# Patient Record
Sex: Male | Born: 2000 | Race: White | Hispanic: No | Marital: Single | Smoking: Never smoker
Health system: Southern US, Community
[De-identification: ages and names within clinical notes are randomized; demographics above are authoritative.]

## PROBLEM LIST (undated history)

## (undated) DIAGNOSIS — Z8674 Personal history of sudden cardiac arrest: Secondary | ICD-10-CM

## (undated) DIAGNOSIS — I4901 Ventricular fibrillation: Secondary | ICD-10-CM

## (undated) DIAGNOSIS — F79 Unspecified intellectual disabilities: Secondary | ICD-10-CM

## (undated) DIAGNOSIS — K254 Chronic or unspecified gastric ulcer with hemorrhage: Secondary | ICD-10-CM

## (undated) DIAGNOSIS — Q210 Ventricular septal defect: Secondary | ICD-10-CM

## (undated) DIAGNOSIS — I471 Supraventricular tachycardia, unspecified: Secondary | ICD-10-CM

## (undated) DIAGNOSIS — F840 Autistic disorder: Secondary | ICD-10-CM

## (undated) HISTORY — PX: VSD REPAIR: SHX276

## (undated) HISTORY — PX: WISDOM TOOTH EXTRACTION: SHX21

## (undated) HISTORY — DX: Ventricular fibrillation: I49.01

## (undated) HISTORY — PX: CYSTOURETHROSCOPY: SHX476

## (undated) HISTORY — PX: PACEMAKER IMPLANT: EP1218

## (undated) HISTORY — DX: Personal history of sudden cardiac arrest: Z86.74

## (undated) HISTORY — PX: ICD IMPLANT: EP1208

## (undated) HISTORY — PX: CARDIAC CATHETERIZATION: SHX172

---
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
## 2019-02-08 IMAGING — DX DG CHEST 1V PORT
1 series · 1 of 1 positions shown · non-contrast
Comparison: 08/29/2015

CLINICAL DATA: Syncope. Patient with history of cardiac arrest and
defibrillator.

EXAM:
PORTABLE CHEST 1 VIEW

[chest ap]
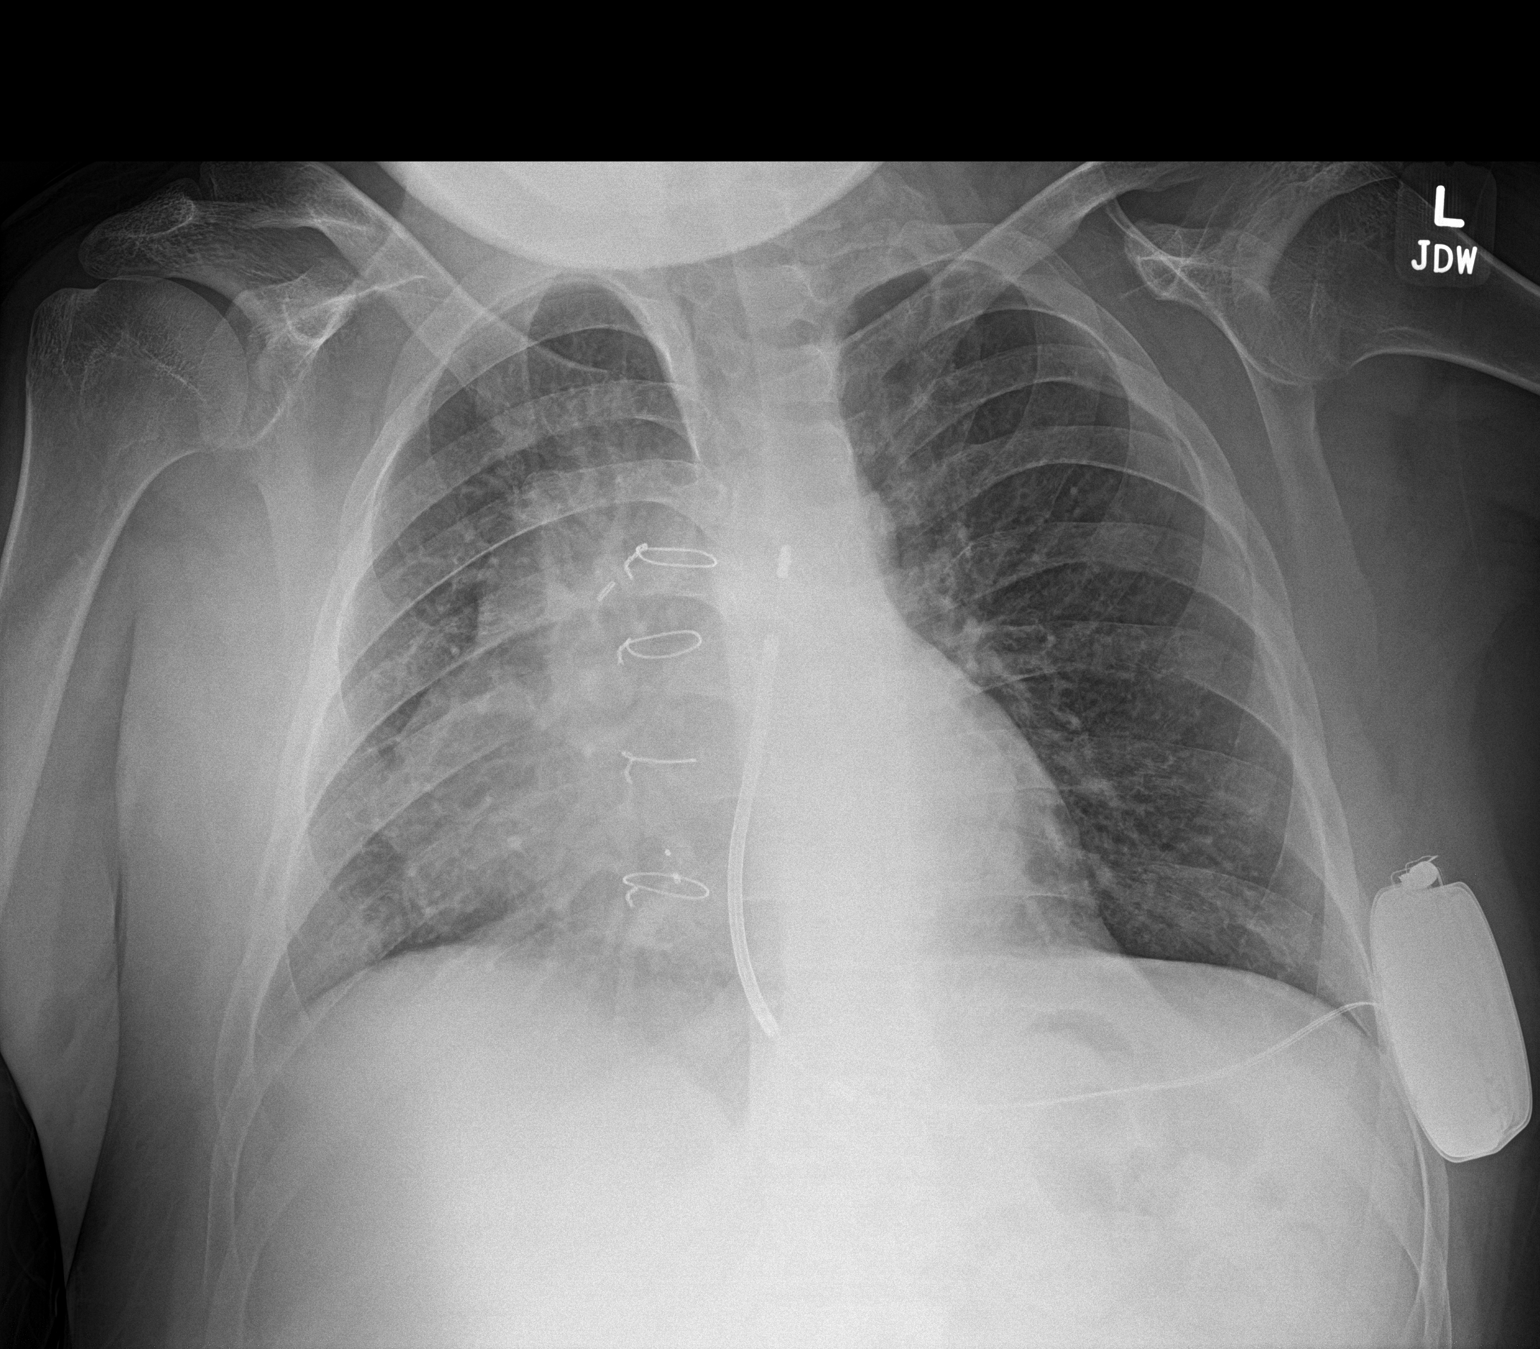

[1 of 1 positions shown; findings below may reference images not displayed]

FINDINGS: The patient is slightly rotated to the RIGHT.

Cardiomegaly and median sternotomy again noted.

A LEFT AICD noted.

There is no evidence of focal airspace disease, pulmonary edema,
suspicious pulmonary nodule/mass, pleural effusion, or pneumothorax.

No acute bony abnormalities are identified.
IMPRESSION: Cardiomegaly without evidence of acute cardiopulmonary disease.

## 2019-05-04 DIAGNOSIS — Z889 Allergy status to unspecified drugs, medicaments and biological substances status: Secondary | ICD-10-CM | POA: Insufficient documentation

## 2019-09-30 DIAGNOSIS — Z01818 Encounter for other preprocedural examination: Secondary | ICD-10-CM | POA: Insufficient documentation

## 2020-01-12 DIAGNOSIS — Z87440 Personal history of urinary (tract) infections: Secondary | ICD-10-CM | POA: Insufficient documentation

## 2020-11-22 ENCOUNTER — Other Ambulatory Visit: Payer: Self-pay

## 2020-11-22 ENCOUNTER — Other Ambulatory Visit (HOSPITAL_COMMUNITY): Payer: Self-pay

## 2020-11-22 ENCOUNTER — Ambulatory Visit (INDEPENDENT_AMBULATORY_CARE_PROVIDER_SITE_OTHER): Payer: Medicare Other

## 2020-11-22 DIAGNOSIS — U071 COVID-19: Secondary | ICD-10-CM

## 2020-11-22 MED ORDER — BEBTELOVIMAB 175 MG/2 ML IV (EUA)
175.0000 mg | Freq: Once | INTRAMUSCULAR | Status: AC
  Administered 2020-11-22: 175 mg via INTRAVENOUS

## 2020-11-22 MED ORDER — EPINEPHRINE 0.3 MG/0.3ML IJ SOAJ: 0.3000 mg | Freq: Once | INTRAMUSCULAR | Status: AC | PRN

## 2020-11-22 MED ORDER — METHYLPREDNISOLONE SODIUM SUCC 125 MG IJ SOLR: 125.0000 mg | Freq: Once | INTRAMUSCULAR | Status: AC | PRN

## 2020-11-22 MED ORDER — ALBUTEROL SULFATE HFA 108 (90 BASE) MCG/ACT IN AERS: 2.0000 | INHALATION_SPRAY | Freq: Once | RESPIRATORY_TRACT | Status: AC | PRN

## 2020-11-22 MED ORDER — SODIUM CHLORIDE 0.9 % IV SOLN: INTRAVENOUS | Status: DC | PRN

## 2020-11-22 MED ORDER — FAMOTIDINE IN NACL 20-0.9 MG/50ML-% IV SOLN: 20.0000 mg | Freq: Once | INTRAVENOUS | Status: AC | PRN

## 2020-11-22 MED ORDER — DIPHENHYDRAMINE HCL 50 MG/ML IJ SOLN: 50.0000 mg | Freq: Once | INTRAMUSCULAR | Status: AC | PRN

## 2020-11-22 NOTE — Progress Notes (Signed)
Diagnosis: COVID  Provider:  Chilton Greathouse, MD  Procedure: Infusion  IV Type: Peripheral, IV Location: R Hand  Bebtelovimab, Dose: 175 mg  Infusion Start Time: 1523  Infusion Stop Time: 1524  Post Infusion IV Care: Observation period completed and Peripheral IV Discontinued  Discharge: Condition: Good, Destination: Home . AVS provided to patient.   Performed by:  Garnette Czech, RN

## 2020-11-22 NOTE — Patient Instructions (Signed)
Patient was given drug information sheet for Bebtelovimab.  Also given cost estimate sheet for Bebtelovimab.  Patient reviewed documentation and questions were answered.  Patient would like to proceed with treatment at this time.   

## 2021-10-01 ENCOUNTER — Encounter: Payer: Self-pay | Admitting: Family

## 2021-10-01 ENCOUNTER — Ambulatory Visit (INDEPENDENT_AMBULATORY_CARE_PROVIDER_SITE_OTHER): Payer: Medicare Other | Admitting: Family

## 2021-10-01 VITALS — BP 120/86 | HR 79 | Temp 98.6°F | Ht 63.0 in | Wt 156.1 lb

## 2021-10-01 DIAGNOSIS — Z9109 Other allergy status, other than to drugs and biological substances: Secondary | ICD-10-CM | POA: Insufficient documentation

## 2021-10-01 DIAGNOSIS — F840 Autistic disorder: Secondary | ICD-10-CM | POA: Diagnosis not present

## 2021-10-01 DIAGNOSIS — F411 Generalized anxiety disorder: Secondary | ICD-10-CM

## 2021-10-01 DIAGNOSIS — I509 Heart failure, unspecified: Secondary | ICD-10-CM

## 2021-10-01 DIAGNOSIS — F79 Unspecified intellectual disabilities: Secondary | ICD-10-CM | POA: Insufficient documentation

## 2021-10-01 DIAGNOSIS — I872 Venous insufficiency (chronic) (peripheral): Secondary | ICD-10-CM

## 2021-10-01 DIAGNOSIS — D696 Thrombocytopenia, unspecified: Secondary | ICD-10-CM

## 2021-10-01 DIAGNOSIS — N390 Urinary tract infection, site not specified: Secondary | ICD-10-CM | POA: Diagnosis not present

## 2021-10-01 DIAGNOSIS — Z95810 Presence of automatic (implantable) cardiac defibrillator: Secondary | ICD-10-CM | POA: Diagnosis not present

## 2021-10-01 DIAGNOSIS — R310 Gross hematuria: Secondary | ICD-10-CM

## 2021-10-01 DIAGNOSIS — G479 Sleep disorder, unspecified: Secondary | ICD-10-CM | POA: Insufficient documentation

## 2021-10-01 DIAGNOSIS — D72810 Lymphocytopenia: Secondary | ICD-10-CM

## 2021-10-01 DIAGNOSIS — J309 Allergic rhinitis, unspecified: Secondary | ICD-10-CM | POA: Insufficient documentation

## 2021-10-01 DIAGNOSIS — Q249 Congenital malformation of heart, unspecified: Secondary | ICD-10-CM

## 2021-10-01 DIAGNOSIS — D509 Iron deficiency anemia, unspecified: Secondary | ICD-10-CM

## 2021-10-01 DIAGNOSIS — I270 Primary pulmonary hypertension: Secondary | ICD-10-CM

## 2021-10-01 DIAGNOSIS — Q204 Double inlet ventricle: Secondary | ICD-10-CM

## 2021-10-01 MED ORDER — TRAZODONE HCL 100 MG PO TABS
100.0000 mg | ORAL_TABLET | Freq: Every day | ORAL | 1 refills | Status: DC
Start: 2021-10-01 — End: 2022-02-26

## 2021-10-01 MED ORDER — LORAZEPAM 2 MG PO TABS: 2.0000 mg | ORAL_TABLET | ORAL | 0 refills | Status: DC | PRN

## 2021-10-01 NOTE — Assessment & Plan Note (Signed)
Refilled trazodone 100 mg po qhs

## 2021-10-01 NOTE — Patient Instructions (Signed)
  Welcome to our clinic, I am happy to have you as my new patient. I am excited to continue on this healthcare journey with you.  Stop by the lab prior to leaving today. I will notify you of your results once received.   Please keep in mind Any my chart messages you send have up to a three business day turnaround for a response.  Phone calls may take up to a one full business day turnaround for a  response.   If you need a medication refill I recommend you request it through the pharmacy as this is easiest for us rather than sending a message and or phone call.   Due to recent changes in healthcare laws, you may see results of your imaging and/or laboratory studies on MyChart before I have had a chance to review them.  I understand that in some cases there may be results that are confusing or concerning to you. Please understand that not all results are received at the same time and often I may need to interpret multiple results in order to provide you with the best plan of care or course of treatment. Therefore, I ask that you please give me 2 business days to thoroughly review all your results before contacting my office for clarification. Should we see a critical lab result, you will be contacted sooner.   It was a pleasure seeing you today! Please do not hesitate to reach out with any questions and or concerns.  Regards,   Dalynn Jhaveri FNP-C  

## 2021-10-01 NOTE — Assessment & Plan Note (Signed)
Continue sildenafil under guidance of cardiologist.

## 2021-10-01 NOTE — Assessment & Plan Note (Addendum)
Continue f/u with cardiologist. Referral placed also for vascular surgeon.  Also suspect arterial insufficiency from purple presentation.

## 2021-10-01 NOTE — Progress Notes (Signed)
New Patient Office Visit  Subjective:  Patient ID: Keith [name], male    DOB: 09/28/2000  Age: 21 y.o. MRN: [id number]  CC:  Chief Complaint  Patient presents with  . Establish Care    HPI Keith [name] is here to establish care as a new patient.  Prior provider was: Dr. Royce Macadamia, [facility]  Accompanied by mom.   Dr. Neena Rhymes, cardiologist:  Sees urology: recurrent UTI's . Follows with them prn   Autism spectrum disorder: [place] in [place], ativan prn especially when flying. Needs refill today for this.   chronic concerns:  IDA: taking iron supplement currently back when had a heart cath had some anemia with low platelets. Seeing hematologist for this currently, f/u/ is in September 2023.   Sleep disorder: hard to wind down at night, mom gives him 100 mg trazodone and helps him to calm down enough to sleep through the night.   Past Medical History:  Diagnosis Date  . Autism   . Gastric ulcer with hemorrhage 02/25/2015  . H/O cardiac arrest    after dental work in june 2021  . 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 SVT (supraventricular tachycardia) ([place])    previously 3x before, total of 4 including today 05/08/15  . Ventricular fibrillation ([place])    inducible during EP study Dr Neena Rhymes, [facility] 06/21/2015  . VSD (ventricular septal defect)     Past Surgical History:  Procedure Laterality Date  . CARDIAC CATHETERIZATION    . CYSTOURETHROSCOPY     x 2, 07/30/2016 and 01/16/2020  . FONTAN PROCEDURE, EXTRACARDIAC  05/28/2015  . ICD IMPLANT     defib  . PACEMAKER IMPLANT    . UPPER GI ENDOSCOPY  02/25/2015   for bleeding ulcer   . VSD REPAIR     3 surgeries -one was a bypass, one was a shunt and fontan procedure   . WISDOM TOOTH EXTRACTION      Family History  Problem Relation Age of Onset  . Prostate cancer Father        age 75  . ALS Maternal Grandmother   . Heart disease Maternal Grandmother   . Heart disease  Maternal Grandfather   . Throat cancer Paternal Grandfather     Social History   Socioeconomic History  . Marital status: Single    Spouse name: Not on file  . Number of children: 3  . Years of education: Not on file  . Highest education level: Not on file  Occupational History  . Not on file  Tobacco Use  . Smoking status: Never  . Smokeless tobacco: Never  Vaping Use  . Vaping Use: Never used  Substance and Sexual Activity  . Alcohol use: No  . Drug use: No  . Sexual activity: Never  Other Topics Concern  . Not on file  Social History Narrative  . Not on file   Social Determinants of Health   Financial Resource Strain: Not on file  Food Insecurity: Not on file  Transportation Needs: Not on file  Physical Activity: Not on file  Stress: Not on file  Social Connections: Not on file  Intimate Partner Violence: Not on file    Outpatient Medications Prior to Visit  Medication Sig Dispense Refill  . amiodarone (PACERONE) 200 MG tablet Take 300 mg by mouth daily.    Marland Kitchen aspirin EC 81 MG tablet Take 81 mg by mouth daily.    Marland Kitchen  Ferrous Sulfate (PC PEDIATRIC IRON DROPS PO) Take by mouth.    . furosemide (LASIX) 10 MG/ML solution Take 10 mg by mouth 2 (two) times daily.    Marland Kitchen loratadine (CHILDRENS LORATADINE) 5 MG/5ML syrup Take 10 mg by mouth daily as needed for allergies.     Marland Kitchen NEXIUM 40 MG packet Take 40 mg by mouth daily.  11  . Sildenafil Citrate 10 MG/ML SUSR Take 10 mg by mouth 3 (three) times daily.    Marland Kitchen spironolactone (ALDACTONE) 25 MG tablet Take 25 mg by mouth daily.  6  . Vitamin D3 (VITAMIN D) 25 MCG tablet Take 1,000 Units by mouth daily.    Marland Kitchen zinc oxide ([employer]S ZINC OXIDE) 20 % ointment Apply 1 application topically as needed for irritation.     Marland Kitchen liver oil-zinc oxide (DESITIN) 40 % ointment Apply 1 Application topically as needed.    Marland Kitchen LORazepam (ATIVAN) 2 MG tablet Take 2 mg by mouth as needed.    . traZODone (DESYREL) 100 MG tablet Take 100 mg by mouth at  bedtime.  5  . ketoconazole (NIZORAL) 2 % cream Apply 1 application topically as needed for irritation.  (Patient not taking: Reported on 10/01/2021)    . propranolol (INDERAL) 60 MG tablet Take 60 mg by mouth 3 (three) times daily.    . ATENOLOL PO Take 25-100 mg by mouth See admin instructions. Liquid Atenolol 5mg /ml; 100mg  in the morning and 25mg  at night (Patient not taking: Reported on 10/01/2021)    . MEXILETINE HCL PO Take 150 mg by mouth every 8 (eight) hours. Liquid Mexitil 10mg /ml; 56ml every 8 hours. (Patient not taking: Reported on 10/01/2021)    . potassium chloride (KLOR-CON) 20 MEQ packet Take 40 mEq by mouth 2 (two) times daily.    . Sotalol HCl 5 MG/ML SOLN Take 80 mg by mouth 2 (two) times daily.  (Patient not taking: Reported on 10/01/2021)    . 0.9 %  sodium chloride infusion      No facility-administered medications prior to visit.    Allergies  Allergen Reactions  . Adhesive [Tape] Itching and Rash    Able to tolerate paper tape     ROS Review of Systems  Review of Systems  Respiratory:  Negative for shortness of breath.   Cardiovascular:  Negative for chest pain and palpitations.  Gastrointestinal:  Negative for constipation and diarrhea.  Genitourinary:  Negative for dysuria, frequency and urgency.  Musculoskeletal:  Negative for myalgias.  Psychiatric/Behavioral:  Negative for depression and suicidal ideas.   All other systems reviewed and are negative.    Objective:    Physical Exam Constitutional:      General: He is not in acute distress.    Appearance: Normal appearance. He is normal weight. He is not ill-appearing, toxic-appearing or diaphoretic.  HENT:     Head: Normocephalic.     Right Ear: Tympanic membrane normal.     Left Ear: Tympanic membrane normal.     Nose: Nose normal.  Eyes:     Pupils: Pupils are equal, round, and reactive to light.  Cardiovascular:     Rate and Rhythm: Regular rhythm. Bradycardia present.     Pulses:          Dorsalis  pedis pulses are 1+ on the right side and 1+ on the left side.       Posterior tibial pulses are 1+ on the right side and 1+ on the left side.     Heart  sounds: Normal heart sounds.  Pulmonary:     Effort: Pulmonary effort is normal.     Breath sounds: Normal breath sounds.  Abdominal:     General: Abdomen is flat. Bowel sounds are normal.     Palpations: Abdomen is soft.     Tenderness: There is no abdominal tenderness.  Musculoskeletal:        General: Normal range of motion.     Cervical back: Normal range of motion.     Right lower leg: 1+ Edema present.     Left lower leg: 1+ Edema present.  Skin:    General: Skin is warm.     Comments: discoloration of bil le with purple pigmentation upon hanging over exam table. Easily blanchable.   Neurological:     General: No focal deficit present.     Mental Status: He is alert.     Motor: Motor function is intact. No weakness.     Coordination: Coordination is intact.     Gait: Gait is intact. Gait normal.  Psychiatric:        Attention and Perception: He is inattentive.        Mood and Affect: Mood normal.        Speech: He is noncommunicative.        Behavior: Behavior is withdrawn. Behavior is not agitated. Behavior is cooperative.        Cognition and Memory: Cognition is impaired.        Judgment: Judgment is inappropriate.      BP 120/86   Pulse 79   Temp 98.6 F (37 C)   Ht 5\' 3"  (1.6 m)   Wt 156 lb 2 oz (70.8 kg)   BMI 27.66 kg/m  Wt Readings from Last 3 Encounters:  10/01/21 156 lb 2 oz (70.8 kg)  11/22/20 157 lb (71.2 kg)  11/01/17 175 lb (79.4 kg) (83 %, Z= 0.96)*   * Growth percentiles are based on CDC (Boys, 2-20 Years) data.     Health Maintenance Due  Topic Date Due  . COVID-19 Vaccine (1) Never done  . HPV VACCINES (1 - Male 2-dose series) Never done  . HIV Screening  Never done  . Hepatitis C Screening  Never done  . INFLUENZA VACCINE  09/24/2021       Topic Date Due  . HPV VACCINES (1 - Male  2-dose series) Never done    No results found for: "TSH" Lab Results  Component Value Date   WBC 4.9 12/25/2018   HGB 12.6 (L) 12/25/2018   HCT 37.0 (L) 12/25/2018   MCV 79.9 (L) 12/25/2018   PLT 105 (L) 12/25/2018   Lab Results  Component Value Date   NA 139 12/25/2018   K 4.5 12/25/2018   CO2 22 12/25/2018   GLUCOSE 83 12/25/2018   BUN 37 (H) 12/25/2018   CREATININE 0.60 (L) 12/25/2018   BILITOT 1.0 12/25/2018   ALKPHOS 141 (H) 12/25/2018   AST 29 12/25/2018   ALT 34 12/25/2018   PROT 7.4 12/25/2018   ALBUMIN 4.4 12/25/2018   CALCIUM 9.5 12/25/2018   ANIONGAP 11 12/25/2018   No results found for: "CHOL" No results found for: "HDL" No results found for: "LDLCALC" No results found for: "TRIG" No results found for: "CHOLHDL" No results found for: "HGBA1C"    Assessment & Plan:   Problem List Items Addressed This Visit       Cardiovascular and Mediastinum   Pulmonary hypertension, primary (HCC)  Continue sildenafil under guidance of cardiologist.       Relevant Medications   Sildenafil Citrate 10 MG/ML SUSR   propranolol (INDERAL) 60 MG tablet   amiodarone (PACERONE) 200 MG tablet   Other Relevant Orders   Ambulatory referral to Vascular Surgery   Single ventricle with unbalanced atrioventricular canal    Continue f/u with cardiology as scheduled.       Relevant Medications   Sildenafil Citrate 10 MG/ML SUSR   propranolol (INDERAL) 60 MG tablet   amiodarone (PACERONE) 200 MG tablet   Other Relevant Orders   Ambulatory referral to Vascular Surgery   Heart failure due to congenital heart disease ([place])    continue lasix and spironolactone as prescribed. Continue ongoing f/u with cardiology as scheduled.       Relevant Medications   Sildenafil Citrate 10 MG/ML SUSR   propranolol (INDERAL) 60 MG tablet   amiodarone (PACERONE) 200 MG tablet     Respiratory   Allergic rhinitis    continue daily claritin as prescribed.         Musculoskeletal and  Integument   Venous stasis dermatitis of both lower extremities    Continue f/u with cardiologist. Referral placed also for vascular surgeon.  Also suspect arterial insufficiency from purple presentation.       Relevant Orders   Ambulatory referral to Vascular Surgery   Sensitivity to sunlight     Genitourinary   Recurrent UTI    Continue f/u with urology as scheduled for ongoing management      Gross hematuria    Cont f/u with urology as scheduled        Hematopoietic and Hemostatic   Thrombocytopenia ([place])    continue f/u with hematology         Other   Autism spectrum disorder - Primary   Relevant Medications   LORazepam (ATIVAN) 2 MG tablet   Other Relevant Orders   Ambulatory referral to Vascular Surgery   Anxiety state    pdmp reviewed rx ativan 2 mg for social situations or flights that are in need of reducing anxiety state.  Mom aware, and knows not to mix with other sedative medications.       Relevant Medications   LORazepam (ATIVAN) 2 MG tablet   traZODone (DESYREL) 100 MG tablet   Iron deficiency anemia    Continue ongoing f/u with hematology as scheduled. Continue daily iron supplement      Relevant Medications   Ferrous Sulfate (PC PEDIATRIC IRON DROPS PO)   Cardiac defibrillator in situ   Sleep disorder    Refilled trazodone 100 mg po qhs       Relevant Medications   traZODone (DESYREL) 100 MG tablet   Intellectual disability   Relevant Orders   Ambulatory referral to Vascular Surgery   Lymphopenia    continue f/u with hematology          Meds ordered this encounter  Medications  . LORazepam (ATIVAN) 2 MG tablet    Sig: Take 1 tablet (2 mg total) by mouth as needed for anxiety.    Dispense:  20 tablet    Refill:  0    Order Specific Question:   Supervising Provider    Answer:   BEDSOLE, AMY E [[id number]]  . traZODone (DESYREL) 100 MG tablet    Sig: Take 1 tablet (100 mg total) by mouth at bedtime.    Dispense:  90 tablet     Refill:  1    Order  Specific Question:   Supervising Provider    Answer:   Eliezer Lofts E [[id number]]    Follow-up: Return in about 1 year (around 10/02/2022) for annuall follow up appointment .    Eugenia Pancoast, FNP

## 2021-10-01 NOTE — Assessment & Plan Note (Signed)
Cont f/u with urology as scheduled

## 2021-10-01 NOTE — Assessment & Plan Note (Signed)
Continue f/u with urology as scheduled for ongoing management

## 2021-10-01 NOTE — Assessment & Plan Note (Signed)
continue daily claritin as prescribed.

## 2021-10-01 NOTE — Assessment & Plan Note (Signed)
Continue ongoing f/u with hematology as scheduled. Continue daily iron supplement

## 2021-10-01 NOTE — Assessment & Plan Note (Signed)
Continue f/u with cardiology as scheduled.  

## 2021-10-01 NOTE — Assessment & Plan Note (Signed)
continue f/u with hematology

## 2021-10-01 NOTE — Assessment & Plan Note (Signed)
continue f/u with hematology  

## 2021-10-01 NOTE — Assessment & Plan Note (Signed)
pdmp reviewed rx ativan 2 mg for social situations or flights that are in need of reducing anxiety state.  Mom aware, and knows not to mix with other sedative medications.

## 2021-10-01 NOTE — Assessment & Plan Note (Signed)
continue lasix and spironolactone as prescribed. Continue ongoing f/u with cardiology as scheduled.

## 2021-10-02 ENCOUNTER — Telehealth: Payer: Self-pay

## 2021-10-02 NOTE — Telephone Encounter (Signed)
Called and informed pt mom about this information. She said she would call pt cardiologist just to give them a heads up. I always told her if she has not heard anything in 2 week from vascular to give [place] a call back.

## 2021-10-02 NOTE — Telephone Encounter (Signed)
-----   Message from Tabitha Dugal, FNP sent at 10/01/2021  4:26 PM EDT ----- Regarding: referral Please call and advise mom I did decide to send in a referral for vascular due to purple lower extremities. She can also check with patient's cardiologist to see if this is deemed necessary however I ready through some of the cardiology notes, and he had only noted red discoloration above the ankle. Today with dangling of legs with some purpling. I think this would be helpful just to be assessed by vascular as well to check out the circulation.   

## 2021-10-02 NOTE — Telephone Encounter (Signed)
-----   Message from Mort Sawyers, [place] sent at 10/01/2021  4:26 PM EDT ----- Regarding: referral Please call and advise mom I did decide to send in a referral for vascular due to purple lower extremities. She can also check with patient's cardiologist to see if this is deemed necessary however I ready through some of the cardiology notes, and he had only noted red discoloration above the ankle. Today with dangling of legs with some purpling. I think this would be helpful just to be assessed by vascular as well to check out the circulation.

## 2021-10-02 NOTE — Telephone Encounter (Signed)
Left message to return call to our office.  

## 2021-10-09 ENCOUNTER — Other Ambulatory Visit: Payer: Self-pay | Admitting: Primary Care

## 2021-10-09 ENCOUNTER — Encounter: Payer: Self-pay | Admitting: Primary Care

## 2021-10-09 ENCOUNTER — Ambulatory Visit (INDEPENDENT_AMBULATORY_CARE_PROVIDER_SITE_OTHER): Payer: Medicare Other | Admitting: Primary Care

## 2021-10-09 ENCOUNTER — Telehealth: Payer: Self-pay

## 2021-10-09 ENCOUNTER — Telehealth: Payer: Medicare Other | Admitting: Nurse Practitioner

## 2021-10-09 VITALS — BP 130/74 | HR 67 | Ht 63.0 in | Wt 147.0 lb

## 2021-10-09 DIAGNOSIS — K591 Functional diarrhea: Secondary | ICD-10-CM

## 2021-10-09 DIAGNOSIS — R197 Diarrhea, unspecified: Secondary | ICD-10-CM | POA: Insufficient documentation

## 2021-10-09 DIAGNOSIS — E876 Hypokalemia: Secondary | ICD-10-CM

## 2021-10-09 LAB — BASIC METABOLIC PANEL
BUN: 17 mg/dL (ref 6–23)
CO2: 29 mEq/L (ref 19–32)
Calcium: 9.4 mg/dL (ref 8.4–10.5)
Chloride: 99 mEq/L (ref 96–112)
Creatinine, Ser: 0.97 mg/dL (ref 0.40–1.50)
GFR: 111.36 mL/min (ref >60.00)
Glucose, Bld: 106 mg/dL — ABNORMAL HIGH (ref 70–99)
Potassium: 2.6 mEq/L — CL (ref 3.5–5.1)
Sodium: 137 mEq/L (ref 135–145)

## 2021-10-09 LAB — CBC WITH DIFFERENTIAL/PLATELET
Basophils Absolute: 0.1 10*3/uL (ref 0.0–0.1)
Basophils Relative: 0.6 % (ref 0.0–3.0)
Eosinophils Absolute: 0 10*3/uL (ref 0.0–0.7)
Eosinophils Relative: 0.3 % (ref 0.0–5.0)
HCT: 47.9 % (ref 39.0–52.0)
Hemoglobin: 16.6 g/dL (ref 13.0–17.0)
Lymphocytes Relative: 4.8 % — ABNORMAL LOW (ref 12.0–46.0)
Lymphs Abs: 0.4 10*3/uL — ABNORMAL LOW (ref 0.7–4.0)
MCHC: 34.6 g/dL (ref 30.0–36.0)
MCV: 91.4 fl (ref 78.0–100.0)
Monocytes Absolute: 1 10*3/uL (ref 0.1–1.0)
Monocytes Relative: 10.4 % (ref 3.0–12.0)
Neutro Abs: 7.8 10*3/uL — ABNORMAL HIGH (ref 1.4–7.7)
Neutrophils Relative %: 83.9 % — ABNORMAL HIGH (ref 43.0–77.0)
Platelets: 75 10*3/uL — ABNORMAL LOW (ref 150.0–400.0)
RBC: 5.24 Mil/uL (ref 4.22–5.81)
RDW: 13.7 % (ref 11.5–15.5)
WBC: 9.3 10*3/uL (ref 4.0–10.5)

## 2021-10-09 MED ORDER — POTASSIUM CHLORIDE 20 MEQ/15ML (10%) PO SOLN: 20.0000 meq | Freq: Two times a day (BID) | ORAL | 0 refills | Status: DC

## 2021-10-09 NOTE — Telephone Encounter (Signed)
Seen by Mayra Reel. Information relayed to her

## 2021-10-09 NOTE — Progress Notes (Signed)
Subjective:    Patient ID: Keith [name], male    DOB: [birth year], 21 y.o.   MRN: [id number]  Diarrhea  Associated symptoms include vomiting. Pertinent negatives include no abdominal pain or fever.    Keith [name] is a very pleasant 21 y.o. male patient of Mort Sawyers, NP with a history of pulmonary hypertension, recurrent UTI, gross hematuria, autism spectrum disorder, cardiac defibrillator in situ who presents today for diarrhea.   His mother joins [place] today who provides information for HPI. Patient is non verbal.   Symptom onset five days ago with one episode of diarrhea. Four days ago he felt feverish to mom, temperature was not checked. He had 2-3 episodes of vomiting four days ago, no vomiting since. Over the last four days he's experienced 10 episodes of watery diarrhea daily.   Three days ago he began perking up, has slowly improved since then, appetite is still reduced overall. He has eaten a few times over the last several days. Today he's appearing better than he did yesterday per mom. He is playing and more interactive. His last episode of diarrhea was about 1 hour ago, has had 4 episodes total today.   His mother has not given him anything for symptoms given his amiodarone. His mother is pushing fluids and protein drinks. His mother cannot recall anything abnormal that he ate. She denies recent antibiotic use. No one else in the home is sick. He has not been grabbing his abdomen.     Review of Systems  Constitutional:  Positive for fatigue. Negative for fever.  Gastrointestinal:  Positive for diarrhea and vomiting. Negative for abdominal pain.         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   SVT (supraventricular tachycardia) (HCC)    previously 3x before, total of 4 including today 05/08/15    Ventricular fibrillation (HCC)    inducible during EP study Dr Selmer [place], [facility] 06/21/2015   VSD (ventricular septal defect)     Social History   Socioeconomic History   Marital status: Single    Spouse name: Not on file   Number of children: 3   Years of education: Not on file   Highest education level: Not on file  Occupational History   Not on file  Tobacco Use   Smoking status: Never   Smokeless tobacco: Never  Vaping Use   Vaping Use: Never used  Substance and Sexual Activity   Alcohol use: No   Drug use: No   Sexual activity: Never  Other Topics Concern   Not on file  Social History Narrative   Not on file   Social Determinants of Health   Financial Resource Strain: Not on file  Food Insecurity: Not on file  Transportation Needs: Not on file  Physical Activity: Not on file  Stress: Not on file  Social Connections: Not on file  Intimate Partner Violence: Not on file    Past Surgical History:  Procedure Laterality Date   CARDIAC CATHETERIZATION     CYSTOURETHROSCOPY     x 2, 07/30/2016 and 01/16/2020   FONTAN PROCEDURE, EXTRACARDIAC  05/28/2015   ICD IMPLANT     defib   PACEMAKER IMPLANT     UPPER GI ENDOSCOPY  02/25/2015   for bleeding ulcer  VSD REPAIR     3 surgeries -one was a bypass, one was a shunt and fontan procedure    WISDOM TOOTH EXTRACTION      Family History  Problem Relation Age of Onset   Prostate cancer Father        age 72   ALS Maternal Grandmother    Heart disease Maternal Grandmother    Heart disease Maternal Grandfather    Throat cancer Paternal Grandfather     Allergies  Allergen Reactions   Adhesive [Tape] Itching and Rash    Able to tolerate paper tape    Silicone Rash    Current Outpatient Medications on File Prior to Visit  Medication Sig Dispense Refill   amiodarone (PACERONE) 200 MG tablet Take 300 mg by mouth daily.     aspirin EC 81 MG tablet Take 81 mg by mouth daily.     Ferrous Sulfate (PC PEDIATRIC IRON  DROPS PO) Take by mouth.     furosemide (LASIX) 10 MG/ML solution Take 10 mg by mouth 2 (two) times daily.     ketoconazole (NIZORAL) 2 % cream Apply 1 application  topically as needed for irritation.     loratadine (CHILDRENS LORATADINE) 5 MG/5ML syrup Take 10 mg by mouth daily as needed for allergies.      LORazepam (ATIVAN) 2 MG tablet Take 1 tablet (2 mg total) by mouth as needed for anxiety. 20 tablet 0   NEXIUM 40 MG packet Take 40 mg by mouth daily.  11   propranolol (INDERAL) 60 MG tablet Take 60 mg by mouth 3 (three) times daily.     Sildenafil Citrate 10 MG/ML SUSR Take 10 mg by mouth 3 (three) times daily.     spironolactone (ALDACTONE) 25 MG tablet Take 25 mg by mouth daily.  6   traZODone (DESYREL) 100 MG tablet Take 1 tablet (100 mg total) by mouth at bedtime. 90 tablet 1   Vitamin D3 (VITAMIN D) 25 MCG tablet Take 1,000 Units by mouth daily.     zinc oxide ([employer]S ZINC OXIDE) 20 % ointment Apply 1 application topically as needed for irritation.      No current facility-administered medications on file prior to visit.    BP 130/74   Pulse 67   Ht 5\' 3"  (1.6 m)   Wt 147 lb (66.7 kg)   SpO2 97%   BMI 26.04 kg/m  Objective:   Physical Exam Cardiovascular:     Rate and Rhythm: Normal rate and regular rhythm.  Pulmonary:     Effort: Pulmonary effort is normal.  Abdominal:     General: Bowel sounds are normal.     Palpations: Abdomen is soft.     Tenderness: There is no abdominal tenderness.  Skin:    General: Skin is warm and dry.  Neurological:     Mental Status: He is alert.     Comments: Playing on mom's phone, smiling at times, does put his head down in her lap a few times.            Assessment & Plan:   Problem List Items Addressed This Visit       Digestive   Acute diarrhea - Primary    Unclear etiology at this point. Fortunately, he is improving per mom. He appears stable today. Vitals WNL.   Stool studies sent off for testing. Labs pending today  for CBC and BMP.  Encouraged fluids and increasing diet as tolerated.  Start Pepto Bismol PRN.  Mom will update.        Relevant Orders   Gastrointestinal Pathogen Pnl RT, PCR   C. difficile GDH and Toxin A/B   Giardia antigen   CBC with Differential/Platelet   Basic metabolic panel       Doreene Nest, NP

## 2021-10-09 NOTE — Progress Notes (Signed)
Virtual Visit Consent   Keith [name], you are scheduled for a virtual visit with Mary-Margaret Daphine Deutscher, FNP, a [facility] provider, today.     Just as with appointments in the office, your consent must be obtained to participate.  Your consent will be active for this visit and any virtual visit you may have with one of our providers in the next 365 days.     If you have a MyChart account, a copy of this consent can be sent to you electronically.  All virtual visits are billed to your insurance company just like a traditional visit in the office.    As this is a virtual visit, video technology does not allow for your provider to perform a traditional examination.  This may limit your provider's ability to fully assess your condition.  If your provider identifies any concerns that need to be evaluated in person or the need to arrange testing (such as labs, EKG, etc.), we will make arrangements to do so.     Although advances in technology are sophisticated, we cannot ensure that it will always work on either your end or our end.  If the connection with a video visit is poor, the visit may have to be switched to a telephone visit.  With either a video or telephone visit, we are not always able to ensure that we have a secure connection.     I need to obtain your verbal consent now.   Are you willing to proceed with your visit today? YES   Keith [name] has provided verbal consent on 10/09/2021 for a virtual visit (video or telephone).   Mary-Margaret Daphine Deutscher, FNP   Date: 10/09/2021 8:00 AM   Virtual Visit via Video Note   I, Mary-Margaret Daphine Deutscher, connected with Keith [name] ([id number], Jul 19, 2000) on 10/09/21 at  8:00 AM EDT by a video-enabled telemedicine application and verified that I am speaking with the correct person using two identifiers.  Location: Patient: Virtual Visit Location Patient: Home Provider: Virtual Visit Location Provider: Mobile   I discussed the  limitations of evaluation and management by telemedicine and the availability of in person appointments. The patient expressed understanding and agreed to proceed.    History of Present Illness: Keith [name] is a 21 y.o. who identifies as a male who was assigned male at birth, and is being seen today for diarrhea .  HPI: Diarrhea  This is a new problem. The current episode started in the past 7 days. The problem occurs 5 to 10 times per day. The stool consistency is described as Blood tinged (only this morning). The patient states that diarrhea awakens him from sleep. Associated symptoms include vomiting (only x1). Pertinent negatives include no abdominal pain or fever. Nothing aggravates the symptoms. There are no known risk factors.    Review of Systems  Constitutional:  Negative for fever.  Gastrointestinal:  Positive for diarrhea and vomiting (only x1). Negative for abdominal pain.    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) 10/01/2021   Sensitivity to sunlight 10/01/2021   Intellectual disability 10/01/2021   Lymphopenia 10/01/2021   Thrombocytopenia (HCC) 10/01/2021    Allergies:  Allergies  Allergen Reactions   Adhesive [Tape] Itching and Rash    Able to tolerate paper tape    Medications:  Current Outpatient Medications:    amiodarone (PACERONE) 200 MG tablet, Take 300 mg by mouth daily., Disp: , Rfl:    aspirin EC 81 MG tablet, Take 81 mg by mouth  daily., Disp: , Rfl:    Ferrous Sulfate (PC PEDIATRIC IRON DROPS PO), Take by mouth., Disp: , Rfl:    furosemide (LASIX) 10 MG/ML solution, Take 10 mg by mouth 2 (two) times daily., Disp: , Rfl:    ketoconazole (NIZORAL) 2 % cream, Apply 1 application topically as needed for irritation.  (Patient not taking: Reported on 10/01/2021), Disp: , Rfl:    loratadine (CHILDRENS LORATADINE) 5 MG/5ML syrup, Take 10 mg by mouth daily as needed for allergies. , Disp: , Rfl:    LORazepam (ATIVAN) 2 MG tablet, Take 1 tablet (2 mg total) by mouth as needed for anxiety., Disp: 20 tablet, Rfl: 0   NEXIUM 40 MG packet, Take 40 mg by mouth daily., Disp: , Rfl: 11   propranolol (INDERAL) 60 MG tablet, Take 60 mg by mouth 3 (three) times daily., Disp: , Rfl:    Sildenafil Citrate 10 MG/ML SUSR, Take 10 mg by mouth 3 (three) times daily., Disp: , Rfl:    spironolactone (ALDACTONE) 25 MG tablet, Take 25 mg by mouth daily., Disp: , Rfl: 6   traZODone (DESYREL) 100 MG tablet, Take 1 tablet (100 mg total) by mouth at bedtime., Disp: 90 tablet, Rfl: 1   Vitamin D3 (VITAMIN D) 25 MCG tablet, Take 1,000 Units by mouth daily., Disp: , Rfl:    zinc oxide ([employer]S ZINC OXIDE) 20 % ointment, Apply 1 application topically as needed for irritation. , Disp: , Rfl:   Observations/Objective: Patient is well-developed, well-nourished in no acute distress.  Resting comfortably  at home.  Head is normocephalic, atraumatic.  No labored breathing.  Speech is clear and coherent with logical content.  Patient is alert and oriented at baseline.  Patient is autisitic and cannot answer any questions.  Assessment and Plan:  Keith [name] in today with chief complaint of Diarrhea   1. Functional diarrhea No imodium due to interaction with amiodarone Try pepto bismol OTC- if no relief by tomorrow morning- will need in person visit [place]land diet Force fluids    Follow Up Instructions: I discussed the assessment and treatment plan with  the patient. The patient was provided an opportunity to ask questions and all were answered. The patient agreed with the plan and demonstrated an understanding of the instructions.  A copy of instructions were sent to the patient via MyChart.  The patient was advised to call back or seek an in-person evaluation if the symptoms worsen or if the condition fails to improve as anticipated.  Time:  I spent 10 minutes with the patient via telehealth technology discussing the above problems/concerns.    Mary-Margaret Daphine Deutscher, FNP

## 2021-10-09 NOTE — Telephone Encounter (Signed)
Critical lab result called by Sy at [facility] Potassium 2.6 Notified [employer] in person.

## 2021-10-09 NOTE — Patient Instructions (Signed)
Diarrhea, Adult Diarrhea is when you pass loose and watery poop (stool) often. Diarrhea can make you feel weak and cause you to lose water in your body (get dehydrated). Losing water in your body can cause you to: Feel tired and thirsty. Have a dry mouth. Go pee (urinate) less often. Diarrhea often lasts 2-3 days. However, it can last longer if it is a sign of something more serious. It is important to treat your diarrhea as told by your doctor. Follow these instructions at home: Eating and drinking     Follow these instructions as told by your doctor: Take an ORS (oral rehydration solution). This is a drink that helps you replace fluids and minerals your body lost. It is sold at pharmacies and stores. Drink plenty of fluids, such as: Water. Ice chips. Diluted fruit juice. Low-calorie sports drinks. Milk, if you want. Avoid drinking fluids that have a lot of sugar or caffeine in them. Eat bland, easy-to-digest foods in small amounts as you are able. These foods include: Bananas. Applesauce. Rice. Low-fat (lean) meats. Toast. Crackers. Avoid alcohol. Avoid spicy or fatty foods.  Medicines Take over-the-counter and prescription medicines only as told by your doctor. If you were prescribed an antibiotic medicine, take it as told by your doctor. Do not stop using the antibiotic even if you start to feel better. General instructions  Wash your hands often using soap and water. If soap and water are not available, use a hand sanitizer. Others in your home should wash their hands as well. Hands should be washed: After using the toilet or changing a diaper. Before preparing, cooking, or serving food. While caring for a sick person. While visiting someone in a hospital. Drink enough fluid to keep your pee (urine) pale yellow. Rest at home while you get better. Take a warm bath to help with any burning or pain from having diarrhea. Watch your condition for any changes. Keep all  follow-up visits as told by your doctor. This is important. Contact a doctor if: You have a fever. Your diarrhea gets worse. You have new symptoms. You cannot keep fluids down. You feel light-headed or dizzy. You have a headache. You have muscle cramps. Get help right away if: You have chest pain. You feel very weak or you pass out (faint). You have bloody or black poop or poop that looks like tar. You have very bad pain, cramping, or bloating in your belly (abdomen). You have trouble breathing or you are breathing very quickly. Your heart is beating very quickly. Your skin feels cold and clammy. You feel confused. You have signs of losing too much water in your body, such as: Dark pee, very little pee, or no pee. Cracked lips. Dry mouth. Sunken eyes. Sleepiness. Weakness. Summary Diarrhea is when you pass loose and watery poop (stool) often. Diarrhea can make you feel weak and cause you to lose water in your body (get dehydrated). Take an ORS (oral rehydration solution). This is a drink that is sold at pharmacies and stores. Eat bland, easy-to-digest foods in small amounts as you are able. Contact a doctor if your condition gets worse. Get help right away if you have signs that you have lost too much water in your body. This information is not intended to replace advice given to you by your health care provider. Make sure you discuss any questions you have with your health care provider. Document Revised: 05/03/2021 Document Reviewed: 08/22/2020 Elsevier Patient Education  2023 [employer]vier Inc.  

## 2021-10-09 NOTE — Patient Instructions (Addendum)
We will be in touch soon regarding results.  Continue to work on hydration.   You may provide him with Pepto Bismol as needed.  It was a pleasure to see you today!  Diarrhea, Adult Diarrhea is frequent loose and watery bowel movements. Diarrhea can make you feel weak and cause you to become dehydrated. Dehydration can make you tired and thirsty, cause you to have a dry mouth, and decrease how often you urinate. Diarrhea typically lasts 2-3 days. However, it can last longer if it is a sign of something more serious. It is important to treat your diarrhea as told by your health care provider. Follow these instructions at home: Eating and drinking     Follow these recommendations as told by your health care provider: Take an oral rehydration solution (ORS). This is an over-the-counter medicine that helps return your body to its normal balance of nutrients and water. It is found at pharmacies and retail stores. Drink plenty of fluids, such as water, ice chips, diluted fruit juice, and low-calorie sports drinks. You can drink milk also, if desired. Avoid drinking fluids that contain a lot of sugar or caffeine, such as energy drinks, sports drinks, and soda. Eat bland, easy-to-digest foods in small amounts as you are able. These foods include bananas, applesauce, rice, lean meats, toast, and crackers. Avoid alcohol. Avoid spicy or fatty foods.  Medicines Take over-the-counter and prescription medicines only as told by your health care provider. If you were prescribed an antibiotic medicine, take it as told by your health care provider. Do not stop using the antibiotic even if you start to feel better. General instructions  Wash your hands often using soap and water. If soap and water are not available, use a hand sanitizer. Others in the household should wash their hands as well. Hands should be washed: After using the toilet or changing a diaper. Before preparing, cooking, or serving  food. While caring for a sick person or while visiting someone in a hospital. Drink enough fluid to keep your urine pale yellow. Rest at home while you recover. Watch your condition for any changes. Take a warm bath to relieve any burning or pain from frequent diarrhea episodes. Keep all follow-up visits as told by your health care provider. This is important. Contact a health care provider if: You have a fever. Your diarrhea gets worse. You have new symptoms. You cannot keep fluids down. You feel light-headed or dizzy. You have a headache. You have muscle cramps. Get help right away if: You have chest pain. You feel extremely weak or you faint. You have bloody or black stools or stools that look like tar. You have severe pain, cramping, or bloating in your abdomen. You have trouble breathing or you are breathing very quickly. Your heart is beating very quickly. Your skin feels cold and clammy. You feel confused. You have signs of dehydration, such as: Dark urine, very little urine, or no urine. Cracked lips. Dry mouth. Sunken eyes. Sleepiness. Weakness. Summary Diarrhea is frequent loose and sometimes watery bowel movements. Diarrhea can make you feel weak and cause you to become dehydrated. Drink enough fluids to keep your urine pale yellow. Make sure that you wash your hands after using the toilet. If soap and water are not available, use hand sanitizer. Contact a health care provider if your diarrhea gets worse or you have new symptoms. Get help right away if you have signs of dehydration. This information is not intended to replace advice given  to you by your health care provider. Make sure you discuss any questions you have with your health care provider. Document Revised: 05/03/2021 Document Reviewed: 08/22/2020 Elsevier Patient Education  2023 [employer].

## 2021-10-09 NOTE — Assessment & Plan Note (Signed)
Unclear etiology at this point. Fortunately, he is improving per mom. He appears stable today. Vitals WNL.   Stool studies sent off for testing. Labs pending today for CBC and BMP.  Encouraged fluids and increasing diet as tolerated.  Start Pepto Bismol PRN.  Mom will update.

## 2021-10-10 ENCOUNTER — Other Ambulatory Visit: Payer: Self-pay | Admitting: Family

## 2021-10-10 DIAGNOSIS — E876 Hypokalemia: Secondary | ICD-10-CM

## 2021-10-10 LAB — C. DIFFICILE GDH AND TOXIN A/B
GDH ANTIGEN: NOT DETECTED
MICRO NUMBER:: 13787874
SPECIMEN QUALITY:: ADEQUATE
TOXIN A AND B: NOT DETECTED

## 2021-10-10 NOTE — Progress Notes (Signed)
ERUM

## 2021-10-11 ENCOUNTER — Other Ambulatory Visit (INDEPENDENT_AMBULATORY_CARE_PROVIDER_SITE_OTHER): Payer: Medicare Other

## 2021-10-11 DIAGNOSIS — E876 Hypokalemia: Secondary | ICD-10-CM

## 2021-10-11 LAB — GASTROINTESTINAL PATHOGEN PNL
CampyloBacter Group: NOT DETECTED
Norovirus GI/GII: NOT DETECTED
Rotavirus A: NOT DETECTED
Salmonella species: DETECTED — AB
Shiga Toxin 1: NOT DETECTED
Shiga Toxin 2: NOT DETECTED
Shigella Species: NOT DETECTED
Vibrio Group: NOT DETECTED
Yersinia enterocolitica: NOT DETECTED

## 2021-10-11 LAB — GIARDIA ANTIGEN
MICRO NUMBER:: 13787846
RESULT:: NOT DETECTED
SPECIMEN QUALITY:: ADEQUATE

## 2021-10-11 LAB — ISOLATE REFERRAL PH
MICRO NUMBER:: 13795361
SPECIMEN QUALITY:: ADEQUATE

## 2021-10-11 LAB — POTASSIUM: Potassium: 3 mEq/L — ABNORMAL LOW (ref 3.5–5.1)

## 2021-10-11 NOTE — Telephone Encounter (Signed)
noted 

## 2021-10-15 ENCOUNTER — Other Ambulatory Visit: Payer: Self-pay | Admitting: Family

## 2021-10-15 DIAGNOSIS — E876 Hypokalemia: Secondary | ICD-10-CM

## 2021-10-15 MED ORDER — POTASSIUM CHLORIDE 20 MEQ/15ML (10%) PO SOLN: 20.0000 meq | Freq: Every day | ORAL | 0 refills | Status: DC

## 2021-10-15 NOTE — Progress Notes (Signed)
Potassium improved some.  Let's continue with 20 meq once daily instead of twice daily for one week then have him f/u one week to see how he is doing.   How are we doing? Staying hydrated? Is he acting ok?

## 2021-10-16 ENCOUNTER — Telehealth: Payer: Self-pay

## 2021-10-16 NOTE — Telephone Encounter (Signed)
Mom called in to schedule follow up. She does state that in regards to Keith [name]'s message Keith [name] has been acting like himself again and believes everything has returned to normal.

## 2021-10-17 ENCOUNTER — Ambulatory Visit: Payer: Medicare Other | Admitting: Primary Care

## 2021-10-24 ENCOUNTER — Encounter: Payer: Self-pay | Admitting: Family

## 2021-10-24 ENCOUNTER — Ambulatory Visit (INDEPENDENT_AMBULATORY_CARE_PROVIDER_SITE_OTHER): Payer: Medicare Other | Admitting: Family

## 2021-10-24 VITALS — HR 62 | Temp 98.6°F | Ht 63.0 in | Wt 154.0 lb

## 2021-10-24 DIAGNOSIS — E876 Hypokalemia: Secondary | ICD-10-CM

## 2021-10-24 DIAGNOSIS — R197 Diarrhea, unspecified: Secondary | ICD-10-CM | POA: Diagnosis not present

## 2021-10-24 NOTE — Progress Notes (Signed)
Established Patient Office Visit  Subjective:  Patient ID: Keith [name], male    DOB: 10/22/2000  Age: 21 y.o. MRN: [id number]  CC:  Chief Complaint  Patient presents with   GI Problem    Follow up     HPI Keith [name] is here today for follow up.   Hypokalemia: was supplemented with potassium 20 meq twice daily x five days, then was followed by 20 meq once daily x five days  CDIFF negative for diarrhea. Was salmonella positive Since then is eating and drinking ok per mom, no longer with diarrhea or soft bowel movements.  Mom states he is again full of energy and not acting weak as he was before.    Lab Results  Component Value Date   K 3.0 (L) 10/11/2021   Prior to this 8/16 was 2.6 sfdf  Low platelets, has been chronic. He has f/u with hematologist next month. Has been 70 at 08/19/21 in the hospital.  Lab Results  Component Value Date   WBC 9.3 10/09/2021   HGB 16.6 10/09/2021   HCT 47.9 10/09/2021   MCV 91.4 10/09/2021   PLT 75.0 (L) 10/09/2021    venous stasis dermatitis of bil le: mom has d/w with his cardiologist, and they were monitoring this at every visit every year.    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   SVT (supraventricular tachycardia) (HCC)    previously 3x before, total of 4 including today 05/08/15   Ventricular fibrillation (HCC)    inducible during EP study Dr Selmer [place], [facility] 06/21/2015   VSD (ventricular septal defect)     Past Surgical History:  Procedure Laterality Date   CARDIAC CATHETERIZATION     CYSTOURETHROSCOPY     x 2, 07/30/2016 and 01/16/2020   FONTAN PROCEDURE, EXTRACARDIAC  05/28/2015   ICD IMPLANT     defib   PACEMAKER IMPLANT     UPPER GI ENDOSCOPY  02/25/2015   for bleeding ulcer    VSD REPAIR     3 surgeries -one was a bypass, one was a shunt and  fontan procedure    WISDOM TOOTH EXTRACTION      Family History  Problem Relation Age of Onset   Prostate cancer Father        age 67   ALS Maternal Grandmother    Heart disease Maternal Grandmother    Heart disease Maternal Grandfather    Throat cancer Paternal Grandfather     Social History   Socioeconomic History   Marital status: Single    Spouse name: Not on file   Number of children: 3   Years of education: Not on file   Highest education level: Not on file  Occupational History   Not on file  Tobacco Use   Smoking status: Never   Smokeless tobacco: Never  Vaping Use   Vaping Use: Never used  Substance and Sexual Activity   Alcohol use: No   Drug use: No   Sexual activity: Never  Other Topics Concern   Not on file  Social History Narrative   Not on file   Social Determinants of Health   Financial Resource Strain: Not on file  Food Insecurity: Not on file  Transportation Needs: Not on file  Physical Activity:  Not on file  Stress: Not on file  Social Connections: Not on file  Intimate Partner Violence: Not on file    Outpatient Medications Prior to Visit  Medication Sig Dispense Refill   amiodarone (PACERONE) 200 MG tablet Take 300 mg by mouth daily.     aspirin EC 81 MG tablet Take 81 mg by mouth daily.     Ferrous Sulfate (PC PEDIATRIC IRON DROPS PO) Take by mouth.     furosemide (LASIX) 10 MG/ML solution Take 10 mg by mouth 2 (two) times daily.     ketoconazole (NIZORAL) 2 % cream Apply 1 application  topically as needed for irritation.     loratadine (CHILDRENS LORATADINE) 5 MG/5ML syrup Take 10 mg by mouth daily as needed for allergies.      LORazepam (ATIVAN) 2 MG tablet Take 1 tablet (2 mg total) by mouth as needed for anxiety. 20 tablet 0   NEXIUM 40 MG packet Take 40 mg by mouth daily.  11   propranolol (INDERAL) 60 MG tablet Take 60 mg by mouth 3 (three) times daily.     Sildenafil Citrate 10 MG/ML SUSR Take 10 mg by mouth 3 (three) times  daily.     spironolactone (ALDACTONE) 25 MG tablet Take 25 mg by mouth daily.  6   traZODone (DESYREL) 100 MG tablet Take 1 tablet (100 mg total) by mouth at bedtime. 90 tablet 1   Vitamin D3 (VITAMIN D) 25 MCG tablet Take 1,000 Units by mouth daily.     zinc oxide (CVS ZINC OXIDE) 20 % ointment Apply 1 application topically as needed for irritation.      potassium chloride 20 MEQ/15ML (10%) SOLN Take 15 mLs (20 mEq total) by mouth daily for 5 days. 75 mL 0   No facility-administered medications prior to visit.    Allergies  Allergen Reactions   Adhesive [Tape] Itching and Rash    Able to tolerate paper tape    Silicone Rash         Objective:    Physical Exam Constitutional:      General: He is not in acute distress.    Appearance: He is normal weight. He is not ill-appearing, toxic-appearing or diaphoretic.  Cardiovascular:     Rate and Rhythm: Normal rate and regular rhythm.  Pulmonary:     Effort: Pulmonary effort is normal.  Skin:    Comments: Normal skin turgor  Neurological:     Mental Status: He is alert. Mental status is at baseline.    Pt refused to allow [place] to take blood pressure and or vital signs today.   Pulse 62   Temp 98.6 F (37 C)   Ht 5\' 3"  (1.6 m)   Wt 154 lb (69.9 kg)   SpO2 92%   BMI 27.28 kg/m  Wt Readings from Last 3 Encounters:  10/24/21 154 lb (69.9 kg)  10/09/21 147 lb (66.7 kg)  10/01/21 156 lb 2 oz (70.8 kg)     Health Maintenance Due  Topic Date Due   COVID-19 Vaccine (1) Never done   HPV VACCINES (1 - Male 2-dose series) Never done   HIV Screening  Never done   Hepatitis C Screening  Never done   INFLUENZA VACCINE  09/24/2021       Topic Date Due   HPV VACCINES (1 - Male 2-dose series) Never done    No results found for: "TSH" Lab Results  Component Value Date   WBC 9.3 10/09/2021  HGB 16.6 10/09/2021   HCT 47.9 10/09/2021   MCV 91.4 10/09/2021   PLT 75.0 (L) 10/09/2021   Lab Results  Component Value Date    NA 137 10/09/2021   K 3.0 (L) 10/11/2021   CO2 29 10/09/2021   GLUCOSE 106 (H) 10/09/2021   BUN 17 10/09/2021   CREATININE 0.97 10/09/2021   BILITOT 1.0 12/25/2018   ALKPHOS 141 (H) 12/25/2018   AST 29 12/25/2018   ALT 34 12/25/2018   PROT 7.4 12/25/2018   ALBUMIN 4.4 12/25/2018   CALCIUM 9.4 10/09/2021   ANIONGAP 11 12/25/2018   GFR 111.36 10/09/2021   No results found for: "CHOL" No results found for: "HDL" No results found for: "LDLCALC" No results found for: "TRIG" No results found for: "CHOLHDL" No results found for: "HGBA1C"    Assessment & Plan:   Problem List Items Addressed This Visit       Digestive   Acute diarrhea    Now resolved and pt eating and drinking well.         Other   Hypokalemia - Primary    Repeat potassium order placed, however, pt not up to it today due to not wanting to be touched. Mom states she will make lab only appt in the next few weeks or earlier if needed.   Advised mom to monitor for s/s lethargy, weakness, and or nausea vomiting.       Relevant Orders   Potassium    No orders of the defined types were placed in this encounter.   Follow-up: Return in about 1 year (around 10/25/2022) for annual follow up .    Mort Sawyers, FNP

## 2021-10-24 NOTE — Assessment & Plan Note (Signed)
Now resolved and pt eating and drinking well.

## 2021-10-24 NOTE — Assessment & Plan Note (Signed)
Repeat potassium order placed, however, pt not up to it today due to not wanting to be touched. Mom states she will make lab only appt in the next few weeks or earlier if needed.   Advised mom to monitor for s/s lethargy, weakness, and or nausea vomiting.

## 2021-10-24 NOTE — Patient Instructions (Signed)
Please schedule an appointment on your way out to return to the lab at your convenience.   Due to recent changes in healthcare laws, you may see results of your imaging and/or laboratory studies on MyChart before I have had a chance to review them.  I understand that in some cases there may be results that are confusing or concerning to you. Please understand that not all results are received at the same time and often I may need to interpret multiple results in order to provide you with the best plan of care or course of treatment. Therefore, I ask that you please give me 2 business days to thoroughly review all your results before contacting my office for clarification. Should we see a critical lab result, you will be contacted sooner.   It was a pleasure seeing you today! Please do not hesitate to reach out with any questions and or concerns.  Regards,   Mort Sawyers FNP-C

## 2021-11-06 ENCOUNTER — Encounter: Payer: Self-pay | Admitting: Family

## 2021-11-06 DIAGNOSIS — H9191 Unspecified hearing loss, right ear: Secondary | ICD-10-CM | POA: Insufficient documentation

## 2021-11-10 ENCOUNTER — Encounter: Payer: Self-pay | Admitting: Family

## 2022-02-26 ENCOUNTER — Other Ambulatory Visit: Payer: Self-pay | Admitting: Family

## 2022-02-26 DIAGNOSIS — G479 Sleep disorder, unspecified: Secondary | ICD-10-CM

## 2022-07-10 ENCOUNTER — Encounter: Payer: Self-pay | Admitting: Family

## 2022-07-10 ENCOUNTER — Ambulatory Visit (INDEPENDENT_AMBULATORY_CARE_PROVIDER_SITE_OTHER): Payer: Medicare Other | Admitting: Family

## 2022-07-10 VITALS — BP 118/82 | HR 80 | Temp 97.9°F | Ht 63.0 in | Wt 160.8 lb

## 2022-07-10 DIAGNOSIS — I471 Supraventricular tachycardia, unspecified: Secondary | ICD-10-CM

## 2022-07-10 DIAGNOSIS — Q249 Congenital malformation of heart, unspecified: Secondary | ICD-10-CM

## 2022-07-10 DIAGNOSIS — I509 Heart failure, unspecified: Secondary | ICD-10-CM | POA: Diagnosis not present

## 2022-07-10 DIAGNOSIS — I872 Venous insufficiency (chronic) (peripheral): Secondary | ICD-10-CM

## 2022-07-10 DIAGNOSIS — K089 Disorder of teeth and supporting structures, unspecified: Secondary | ICD-10-CM | POA: Diagnosis not present

## 2022-07-10 DIAGNOSIS — F840 Autistic disorder: Secondary | ICD-10-CM

## 2022-07-10 DIAGNOSIS — N390 Urinary tract infection, site not specified: Secondary | ICD-10-CM

## 2022-07-10 DIAGNOSIS — Z0000 Encounter for general adult medical examination without abnormal findings: Secondary | ICD-10-CM | POA: Insufficient documentation

## 2022-07-10 DIAGNOSIS — Z0001 Encounter for general adult medical examination with abnormal findings: Secondary | ICD-10-CM | POA: Insufficient documentation

## 2022-07-10 MED ORDER — NEXIUM 40 MG PO PACK: 40.0000 mg | PACK | Freq: Every day | ORAL | 3 refills | Status: DC

## 2022-07-10 NOTE — Assessment & Plan Note (Signed)
Stable at baseline with medications

## 2022-07-10 NOTE — Assessment & Plan Note (Signed)
Patient Counseling(The following topics were reviewed): ? Preventative care handout given to pt  ?Health maintenance and immunizations reviewed. Please refer to Health maintenance section. ?Pt advised on safe sex, wearing seatbelts in car, and proper nutrition ?labwork ordered today for annual ?Dental health: Discussed importance of regular tooth brushing, flossing, and dental visits. ? ? ?

## 2022-07-10 NOTE — Assessment & Plan Note (Signed)
No recent episodes

## 2022-07-10 NOTE — Assessment & Plan Note (Signed)
Stable

## 2022-07-10 NOTE — Assessment & Plan Note (Addendum)
stable followed closely by cardiologist.

## 2022-07-10 NOTE — Assessment & Plan Note (Signed)
Pt obtains dental exams every two years. utd

## 2022-07-10 NOTE — Progress Notes (Signed)
Subjective:  Patient ID: [name], male    DOB: 08/25/2000  Age: 22 y.o. MRN: [id number]  Patient Care Team: Keith Sawyers, FNP as PCP - General (Family Medicine)   CC:  Chief Complaint  Patient presents with   Annual Exam    HPI Keith [name] is a 22 y.o. male who presents today for an annual physical exam. He reports consuming a general diet. He generally feels well. He reports sleeping well. He does not have additional problems to discuss today. He is accompanied by his Mom Keith [name] today. She is bringing him to daily adult day care and transitioning to another day care that requires an updated physical form.   Vision:Not within last year Dental:Receives regular dental care STD:The patient denies history of sexually transmitted disease.  Lung Cancer Screening with low-dose Chest CT: n/a - Adults age 43-80 who are current cigarette smokers or quit within the last 15 years. Must have 20 pack year history.  AAA Screening: n/a - Men age 59-75 who have ever smoked  Pt is without acute concerns.   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 He also has SVT which is stable.   DEPRESSION SCREENING    07/10/2022    7:39 AM 10/09/2021   11:58 AM  PHQ 2/9 Scores  PHQ - 2 Score 0   Exception Documentation  Medical reason     ROS: Negative unless specifically indicated above in HPI.    Current Outpatient Medications:    amiodarone (PACERONE) 200 MG tablet, Take 300 mg by mouth daily., Disp: , Rfl:    aspirin EC 81 MG tablet, Take 81 mg by mouth daily., Disp: , Rfl:    furosemide (LASIX) 10 MG/ML solution, Take 10 mg by mouth 2 (two) times daily., Disp: , Rfl:    ketoconazole (NIZORAL) 2 % cream, Apply 1 application  topically as needed for irritation., Disp: , Rfl:    loratadine (CHILDRENS LORATADINE) 5 MG/5ML syrup, Take 10 mg by mouth daily as needed for allergies. , Disp: , Rfl:     LORazepam (ATIVAN) 2 MG tablet, Take 1 tablet (2 mg total) by mouth as needed for anxiety., Disp: 20 tablet, Rfl: 0   propranolol (INDERAL) 60 MG tablet, Take 60 mg by mouth 3 (three) times daily., Disp: , Rfl:    Sildenafil Citrate 10 MG/ML SUSR, Take 10 mg by mouth 3 (three) times daily., Disp: , Rfl:    spironolactone (ALDACTONE) 25 MG tablet, Take 25 mg by mouth daily., Disp: , Rfl: 6   traZODone (DESYREL) 100 MG tablet, TAKE 1 TABLET BY MOUTH EVERYDAY AT BEDTIME, Disp: 90 tablet, Rfl: 1   Vitamin D3 (VITAMIN D) 25 MCG tablet, Take 1,000 Units by mouth daily., Disp: , Rfl:    zinc oxide ([employer]S ZINC OXIDE) 20 % ointment, Apply 1 application topically as needed for irritation. , Disp: , Rfl:    NEXIUM 40 MG packet, Take 40 mg by mouth daily., Disp: 90 each, Rfl: 3   potassium chloride 20 MEQ/15ML (10%) SOLN, Take 15 mLs (20 mEq total) by mouth daily for 5 days., Disp: 75 mL, Rfl: 0    Objective:    BP 118/82 (BP Location: Left Arm)   Pulse 80   Temp 97.9 F (36.6 C) (Temporal)   Ht 5\' 3"  (1.6 m)   Wt 160 lb 12.8 oz (72.9 kg)   SpO2 96%   BMI 28.48 kg/m  BP Readings from Last 3 Encounters:  07/10/22 118/82  10/09/21 130/74  10/01/21 120/86      Physical Exam Vitals reviewed.  Constitutional:      General: He is not in acute distress.    Appearance: Normal appearance. He is normal weight. He is not ill-appearing, toxic-appearing or diaphoretic.  HENT:     Head: Normocephalic.     Right Ear: Tympanic membrane normal.     Left Ear: Tympanic membrane normal.     Nose: Nose normal.     Mouth/Throat:     Mouth: Mucous membranes are moist.  Eyes:     Pupils: Pupils are equal, round, and reactive to light.  Cardiovascular:     Rate and Rhythm: Normal rate and regular rhythm.     Pulses:          Dorsalis pedis pulses are 1+ on the right side and 1+ on the left side.       Posterior tibial pulses are 1+ on the right side and 1+ on the left side.     Comments: Redness and  purple hue on bil lower extremities Pulmonary:     Effort: Pulmonary effort is normal.     Breath sounds: Normal breath sounds.  Abdominal:     General: Abdomen is flat.     Tenderness: There is no abdominal tenderness.  Musculoskeletal:        General: Normal range of motion.     Right lower leg: No edema.     Left lower leg: No edema.  Skin:    General: Skin is warm.  Neurological:     General: No focal deficit present.     Mental Status: He is alert and oriented to person, place, and time. Mental status is at baseline.  Psychiatric:        Attention and Perception: He is inattentive.        Speech: He is noncommunicative.        Behavior: Behavior is agitated and hyperactive. Behavior is cooperative.        Cognition and Memory: Cognition is impaired.        Judgment: Judgment is impulsive.          Assessment & Plan:  Venous stasis dermatitis of both lower extremities Assessment & Plan: stable followed closely by cardiologist.    SVT (supraventricular tachycardia) Assessment & Plan: Stable.    Heart failure due to congenital heart disease [facility]) Assessment & Plan: Stable following with cardiologist regularly    Dental disease Assessment & Plan: Pt obtains dental exams every two years. utd   Recurrent UTI Assessment & Plan: No recent episodes   Autism spectrum disorder Assessment & Plan: Stable at baseline with medications     Encounter for annual general medical examination without abnormal findings in adult Assessment & Plan: Patient Counseling(The following topics were reviewed):  Preventative care handout given to pt  Health maintenance and immunizations reviewed. Please refer to Health maintenance section. Pt advised on safe sex, wearing seatbelts in car, and proper nutrition labwork ordered today for annual Dental health: Discussed importance of regular tooth brushing, flossing, and dental visits.    Other orders -     NexIUM; Take 40  mg by mouth daily.  Dispense: 90 each; Refill: 3  Will need PPD , pt to come back Monday for nurse only visit, have it read within 72 hours    Follow-up: Return in about 1 year (around 07/10/2023) for f/u CPE.  Keith Pancoast, FNP

## 2022-07-10 NOTE — Assessment & Plan Note (Signed)
Stable following with cardiologist regularly

## 2022-07-12 ENCOUNTER — Other Ambulatory Visit: Payer: Self-pay | Admitting: Family

## 2022-07-15 ENCOUNTER — Other Ambulatory Visit (HOSPITAL_COMMUNITY): Payer: Self-pay

## 2022-07-15 NOTE — Telephone Encounter (Signed)
Does pt require packets or would pt be able to use capsules? Capsules are covered at no cost to pt per test claim

## 2022-07-16 ENCOUNTER — Ambulatory Visit: Payer: Medicare Other

## 2022-07-16 NOTE — Telephone Encounter (Signed)
Please advise 

## 2022-07-22 ENCOUNTER — Telehealth: Payer: Self-pay | Admitting: Family

## 2022-07-22 ENCOUNTER — Other Ambulatory Visit (HOSPITAL_COMMUNITY): Payer: Self-pay

## 2022-07-22 ENCOUNTER — Telehealth: Payer: Self-pay

## 2022-07-22 NOTE — Telephone Encounter (Signed)
Pt has severe autism and unable to swallow full capsules Would we be able to get this covered because of this?

## 2022-07-22 NOTE — Telephone Encounter (Signed)
[employer] contacted the office regarding prior auth for medication pantoprazole needed for patient. Caller stated they needed some clarification with a few questions, says patient has a diagnosis of GERD   Is the patient using medication as short term treatment for erosive esophagitis? If they are using medication for maintenance of healing erosive esophagitis and reduction of daytime/ nighttime heartburn?  Please advise [phone], thank you.

## 2022-07-22 NOTE — Telephone Encounter (Signed)
Patient Advocate Encounter   Received notification from [facility] Part D that prior authorization for Pantoprazole packets is required.   PA submitted on 07/22/2022 Key [id number] Insurance [employer] Part D Status is pending

## 2022-07-22 NOTE — Telephone Encounter (Signed)
We sure can try! I have submitted a PA to [facility], I will document and update office in a separate encounter. Fingers crossed!

## 2022-07-23 ENCOUNTER — Other Ambulatory Visit (HOSPITAL_COMMUNITY): Payer: Self-pay

## 2022-07-23 NOTE — Telephone Encounter (Signed)
PA was approved. 

## 2022-07-23 NOTE — Telephone Encounter (Signed)
Noted  

## 2022-07-23 NOTE — Telephone Encounter (Signed)
Pharmacy Patient Advocate Encounter  Prior Authorization for Pantoprazole packets has been approved by [employer] (ins).    PA # [id number] Effective dates: 07/23/2022 through 02/24/2023  Per test claim, pantoprazole packets should be able to be filled at the pharmacy for a copay of $0.00. Please note that this test claim was run through a [facility] and is subject to change depending on dispensing pharmacy.

## 2022-07-23 NOTE — Telephone Encounter (Signed)
Great news! It has been approved.

## 2022-07-30 ENCOUNTER — Encounter: Payer: Self-pay | Admitting: Family

## 2022-07-30 DIAGNOSIS — R12 Heartburn: Secondary | ICD-10-CM

## 2022-07-30 MED ORDER — PANTOPRAZOLE SODIUM 40 MG PO PACK
40.0000 mg | PACK | Freq: Every day | ORAL | 5 refills | Status: DC
Start: 2022-07-30 — End: 2022-11-14

## 2022-07-30 NOTE — Telephone Encounter (Signed)
Pantoprazole is the covered medication. Also, see the above request.

## 2022-07-31 NOTE — Telephone Encounter (Signed)
Form received and placed in Tabitha's box in her office. 

## 2022-07-31 NOTE — Telephone Encounter (Signed)
Patient mom came in and dropped off paperwork for after gateway. Placed in Tabitha's box upfront. Thank you!

## 2022-08-01 ENCOUNTER — Other Ambulatory Visit (HOSPITAL_COMMUNITY): Payer: Self-pay

## 2022-08-01 NOTE — Telephone Encounter (Signed)
Form completed and in outbox

## 2022-08-05 ENCOUNTER — Ambulatory Visit: Payer: Medicare Other

## 2022-08-06 NOTE — Telephone Encounter (Signed)
Pt's mom brought back by one of the after gateway forms for [place] to sign & comp. Pt's mom stated the page was never signed. Pt's mom requested ppw to be comp & a call back @ [phone] once its ready. Form is on dugal's folder

## 2022-08-07 NOTE — Telephone Encounter (Signed)
I do remember signing this exact form, so not sure what happened however I do have the form in my inbox now and I have signed it and placed in outbox.  Maybe I read and didn't sign, I am not sure. Sorry for inconvenience.

## 2022-08-07 NOTE — Telephone Encounter (Signed)
Have looked in your folder have you received this form?

## 2022-08-26 ENCOUNTER — Other Ambulatory Visit: Payer: Self-pay | Admitting: Family

## 2022-08-26 DIAGNOSIS — G479 Sleep disorder, unspecified: Secondary | ICD-10-CM

## 2022-09-22 ENCOUNTER — Other Ambulatory Visit: Payer: Self-pay

## 2022-09-22 ENCOUNTER — Emergency Department (HOSPITAL_BASED_OUTPATIENT_CLINIC_OR_DEPARTMENT_OTHER)
Admission: EM | Admit: 2022-09-22 | Discharge: 2022-09-22 | Disposition: A | Discharge status: Home / Self Care | Payer: Medicare Other | Attending: Emergency Medicine | Admitting: Emergency Medicine

## 2022-09-22 ENCOUNTER — Ambulatory Visit (HOSPITAL_BASED_OUTPATIENT_CLINIC_OR_DEPARTMENT_OTHER): Admission: RE | Admit: 2022-09-22 | Payer: Medicare Other | Source: Ambulatory Visit

## 2022-09-22 ENCOUNTER — Emergency Department (HOSPITAL_BASED_OUTPATIENT_CLINIC_OR_DEPARTMENT_OTHER): Payer: Medicare Other

## 2022-09-22 ENCOUNTER — Encounter (HOSPITAL_BASED_OUTPATIENT_CLINIC_OR_DEPARTMENT_OTHER): Payer: Self-pay | Admitting: Emergency Medicine

## 2022-09-22 DIAGNOSIS — M79661 Pain in right lower leg: Secondary | ICD-10-CM | POA: Diagnosis not present

## 2022-09-22 DIAGNOSIS — R2241 Localized swelling, mass and lump, right lower limb: Secondary | ICD-10-CM | POA: Insufficient documentation

## 2022-09-22 DIAGNOSIS — L03115 Cellulitis of right lower limb: Secondary | ICD-10-CM

## 2022-09-22 DIAGNOSIS — M7989 Other specified soft tissue disorders: Secondary | ICD-10-CM

## 2022-09-22 DIAGNOSIS — R509 Fever, unspecified: Secondary | ICD-10-CM | POA: Diagnosis present

## 2022-09-22 LAB — COMPREHENSIVE METABOLIC PANEL
ALT: 51 U/L — ABNORMAL HIGH (ref 0–44)
AST: 42 U/L — ABNORMAL HIGH (ref 15–41)
Albumin: 3.9 g/dL (ref 3.5–5.0)
Alkaline Phosphatase: 138 U/L — ABNORMAL HIGH (ref 38–126)
Anion gap: 9 (ref 5–15)
BUN: 20 mg/dL (ref 6–20)
CO2: 21 mmol/L — ABNORMAL LOW (ref 22–32)
Calcium: 8.7 mg/dL — ABNORMAL LOW (ref 8.9–10.3)
Chloride: 100 mmol/L (ref 98–111)
Creatinine, Ser: 0.88 mg/dL (ref 0.61–1.24)
GFR, Estimated: >60 mL/min (ref >60)
Glucose, Bld: 131 mg/dL — ABNORMAL HIGH (ref 70–99)
Potassium: 3.7 mmol/L (ref 3.5–5.1)
Sodium: 130 mmol/L — ABNORMAL LOW (ref 135–145)
Total Bilirubin: 3.8 mg/dL — ABNORMAL HIGH (ref 0.3–1.2)
Total Protein: 6.3 g/dL — ABNORMAL LOW (ref 6.5–8.1)

## 2022-09-22 LAB — CBC WITH DIFFERENTIAL/PLATELET
Abs Immature Granulocytes: 0.03 10*3/uL (ref 0.00–0.07)
Basophils Absolute: 0 10*3/uL (ref 0.0–0.1)
Basophils Relative: 0 %
Eosinophils Absolute: 0 10*3/uL (ref 0.0–0.5)
Eosinophils Relative: 0 %
HCT: 39.9 % (ref 39.0–52.0)
Hemoglobin: 13.7 g/dL (ref 13.0–17.0)
Immature Granulocytes: 0 %
Lymphocytes Relative: 5 %
Lymphs Abs: 0.3 10*3/uL — ABNORMAL LOW (ref 0.7–4.0)
MCH: 32.3 pg (ref 26.0–34.0)
MCHC: 34.3 g/dL (ref 30.0–36.0)
MCV: 94.1 fL (ref 80.0–100.0)
Monocytes Absolute: 0.6 10*3/uL (ref 0.1–1.0)
Monocytes Relative: 9 %
Neutro Abs: 5.7 10*3/uL (ref 1.7–7.7)
Neutrophils Relative %: 86 %
Platelets: 49 10*3/uL — ABNORMAL LOW (ref 150–400)
RBC: 4.24 MIL/uL (ref 4.22–5.81)
RDW: 13.4 % (ref 11.5–15.5)
WBC: 6.7 10*3/uL (ref 4.0–10.5)
nRBC: 0 % (ref 0.0–0.2)

## 2022-09-22 LAB — CULTURE, BLOOD (ROUTINE X 2)
Culture: NO GROWTH
Culture: NO GROWTH
Special Requests: ADEQUATE

## 2022-09-22 LAB — LACTIC ACID, PLASMA: Lactic Acid, Venous: 1.1 mmol/L (ref 0.5–1.9)

## 2022-09-22 LAB — D-DIMER, QUANTITATIVE: D-Dimer, Quant: 1.82 ug/mL-FEU — ABNORMAL HIGH (ref 0.00–0.50)

## 2022-09-22 MED ORDER — SODIUM CHLORIDE 0.9 % IV SOLN
1.0000 g | Freq: Once | INTRAVENOUS | Status: AC
  Administered 2022-09-22: 1 g via INTRAVENOUS
  Filled 2022-09-22: qty 10

## 2022-09-22 MED ORDER — ENOXAPARIN SODIUM 80 MG/0.8ML IJ SOSY
1.0000 mg/kg | PREFILLED_SYRINGE | Freq: Once | INTRAMUSCULAR | Status: AC
  Administered 2022-09-22: 75 mg via SUBCUTANEOUS
  Filled 2022-09-22: qty 0.8

## 2022-09-22 NOTE — ED Notes (Signed)
Pt in bed, family at bedside, transport at bedside, bedside report to transport team, family states that they have no further needs before transport.  Pt from department via [facility] transport.

## 2022-09-22 NOTE — ED Notes (Signed)
[facility] called to transport patient. [facility] unable to give an eta at this time and [facility] stated that it would be after shift change before they could arrive. [facility] will call before dispatching truck to see if pt has been taken by [facility] or has a [facility] truck en route. If [facility] does not have a truck en route or hasn't gotten pt then [facility] will transport. Must ensure that whichever service picks up patient that the other service is cancelled, as well as that mother is notified that she is unable to ride with either service.

## 2022-09-22 NOTE — ED Triage Notes (Signed)
Pt presents for fever since Sat, R leg swelling and redness since yesterday afternoon, and poor appetite.  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 Did fall Saturday morning as well. Last tylenol 7pm.    H/o vfib, has arrested multiple times

## 2022-09-22 NOTE — ED Notes (Signed)
Pt in bed, family at bedside, pt is calm and cooperative, resps even and unlabored, family wants to know if pt can eat, md at bedside.

## 2022-09-22 NOTE — ED Provider Notes (Signed)
[place] EMERGENCY DEPARTMENT AT [facility]  Provider Note  CSN: [id number] Arrival date & time: 09/22/22 7829  History Chief Complaint  Patient presents with   Fever    Keith [name] is a 22 y.o. nonverbal male with history of congenital heart defect, managed by [facility], has AICD/Pacer brought by mother and aunt for evaluation of fever up to 100.97F at home over the last several days. Had a fall 2 days ago but no definite injuries. He has not been as active as usual yesterday or today. Earlier this evening they noticed his R leg was getting red and swollen. He vomited a few days ago but not since then. No cough or congestion.    Home Medications Prior to Admission medications   Medication Sig Start Date End Date Taking? Authorizing Provider  amiodarone (PACERONE) 200 MG tablet Take 300 mg by mouth daily. 08/03/21   [provider]  aspirin EC 81 MG tablet Take 81 mg by mouth daily.    [provider]  furosemide (LASIX) 10 MG/ML solution Take 10 mg by mouth 2 (two) times daily. 12/02/18   [provider]  ketoconazole (NIZORAL) 2 % cream Apply 1 application  topically as needed for irritation. 03/28/15   [provider]  loratadine (CHILDRENS LORATADINE) 5 MG/5ML syrup Take 10 mg by mouth daily as needed for allergies.     [provider]  LORazepam (ATIVAN) 2 MG tablet Take 1 tablet (2 mg total) by mouth as needed for anxiety. 10/01/21   Mort Sawyers, FNP  pantoprazole sodium (PROTONIX) 40 mg Place 40 mg into feeding tube daily. 07/30/22 01/26/23  Mort Sawyers, FNP  potassium chloride 20 MEQ/15ML (10%) SOLN Take 15 mLs (20 mEq total) by mouth daily for 5 days. 10/15/21 10/20/21  Mort Sawyers, FNP  propranolol (INDERAL) 60 MG tablet Take 60 mg by mouth 3 (three) times daily. 09/02/21   [provider]  Sildenafil Citrate 10 MG/ML SUSR Take 10 mg by mouth 3 (three) times daily. 09/30/21   [provider]   spironolactone (ALDACTONE) 25 MG tablet Take 25 mg by mouth daily. 10/06/17   [provider]  traZODone (DESYREL) 100 MG tablet TAKE 1 TABLET BY MOUTH EVERYDAY AT BEDTIME 08/26/22   Mort Sawyers, FNP  Vitamin D3 (VITAMIN D) 25 MCG tablet Take 1,000 Units by mouth daily.    [provider]  zinc oxide (CVS ZINC OXIDE) 20 % ointment Apply 1 application topically as needed for irritation.     [provider]     Allergies    Adhesive [tape], Silicone, and Wound dressing adhesive   Review of Systems   Review of Systems Please see HPI for pertinent positives and negatives  Physical Exam BP (!) 91/54   Pulse 60   Temp 98 F (36.7 C) (Axillary)   Resp 15   Wt 74.8 kg   SpO2 95%   BMI 29.21 kg/m   Physical Exam Vitals and nursing note reviewed.  Constitutional:      Appearance: Normal appearance.  HENT:     Head: Normocephalic and atraumatic.     Nose: Nose normal.     Mouth/Throat:     Mouth: Mucous membranes are moist.  Eyes:     Extraocular Movements: Extraocular movements intact.     Conjunctiva/sclera: Conjunctivae normal.  Cardiovascular:     Rate and Rhythm: Normal rate.  Pulmonary:     Effort: Pulmonary effort is normal.  Breath sounds: Normal breath sounds.  Abdominal:     General: Abdomen is flat.     Palpations: Abdomen is soft.     Tenderness: There is no abdominal tenderness.  Musculoskeletal:        General: Swelling and tenderness present. Normal range of motion.     Cervical back: Neck supple.     Comments: Erythema and induration to his R leg, some chronic skin changes to L leg.   Skin:    General: Skin is warm and dry.  Neurological:     General: No focal deficit present.     Mental Status: He is alert.  Psychiatric:        Mood and Affect: Mood normal.     ED Results / Procedures / Treatments   EKG None  Procedures Procedures  Medications Ordered in the ED Medications  enoxaparin (LOVENOX) injection 75 mg  (has no administration in time range)  cefTRIAXone (ROCEPHIN) 1 g in sodium chloride 0.9 % 100 mL IVPB (0 g Intravenous Stopped 09/22/22 0324)    Initial Impression and Plan  Patient with complex PMH here with low grade fever and R leg redness and swelling. Consider infection vs DVT. Will check labs, xray, cultures.   ED Course   Clinical Course as of 09/22/22 0630  Mon Sep 22, 2022  0155 CMP with mild hyponatremia. Dimer is elevated, [place] is not available at this time to evaluate for DVT.  [CS]  0156 Lactic acid is normal.  [CS]  0212 CBC has normal WBC. Will begin Abx per Cellulitis protocol. I personally viewed the images from radiology studies and agree with radiologist interpretation: Xray neg for fracture, Plan discussion with Peds Cards for recommendations.  [CS]  [phone] Spoke with Dr. Mindi Junker, [facility], who requests that the patient be transferred to their ED for further management.  [CS]    Clinical Course User Index [CS] Pollyann Savoy, MD     MDM Rules/Calculators/A&P Medical Decision Making Problems Addressed: Pain and swelling of right lower leg: acute illness or injury  Amount and/or Complexity of Data Reviewed Labs: ordered. Decision-making details documented in ED Course. Radiology: ordered and independent interpretation performed. Decision-making details documented in ED Course.  Risk Prescription drug management. Decision regarding hospitalization.     Final Clinical Impression(s) / ED Diagnoses Final diagnoses:  Pain and swelling of right lower leg    Rx / DC Orders ED Discharge Orders     None        Pollyann Savoy, MD 09/22/22 0630

## 2022-09-22 NOTE — ED Provider Notes (Signed)
  Physical Exam  BP (!) 91/54   Pulse 60   Temp 98 F (36.7 C) (Axillary)   Resp 15   Wt 74.8 kg   SpO2 95%   BMI 29.21 kg/m   Physical Exam  Procedures  Procedures  ED Course / MDM   Clinical Course as of 09/22/22 0710  Mon Sep 22, 2022  0155 CMP with mild hyponatremia. Dimer is elevated, [place] is not available at this time to evaluate for DVT.  [CS]  0156 Lactic acid is normal.  [CS]  0212 CBC has normal WBC. Will begin Abx per Cellulitis protocol. I personally viewed the images from radiology studies and agree with radiologist interpretation: Xray neg for fracture, Plan discussion with Peds Cards for recommendations.  [CS]  [phone] Spoke with Dr. Mindi Junker, [facility], who requests that the patient be transferred to their ED for further management.  [CS]    Clinical Course User Index [CS] Pollyann Savoy, MD    Received care of patient at [facility].  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    Is awaiting ED to ED transfer at [facility] at this time, awaiting transportation. If still here when [place] available will obtain to eval for DVT.   Evaluated-benign abdominal exam ,doubt acute intraabdominal surgical emergency. Palpable pulses, doubt acute arterial thrombus, no air on XR< no crepitus, at this time lower suspicoin for necrotizing infection. Did discuss would consider adding vancomycin for mrsa coverage however transport is about to arrive and do not want to delay transport at this time.  Stable for transport to [facility].       Alvira Monday, MD 09/22/22 [phone]

## 2022-09-22 NOTE — ED Notes (Signed)
Per [facility] is in route to transport patient to [facility].

## 2022-09-22 NOTE — ED Notes (Signed)
Patient brief changed, gown changed, bed linens changed at this time.

## 2022-09-22 NOTE — ED Notes (Signed)
Pt in bed, pt moving all extremities, pt has palpable pedal pulse, R leg is warm and red

## 2022-09-30 ENCOUNTER — Encounter: Payer: Self-pay | Admitting: Family

## 2022-09-30 ENCOUNTER — Telehealth: Payer: Self-pay

## 2022-09-30 NOTE — Transitions of Care (Post Inpatient/ED Visit) (Signed)
   09/30/2022  Name: Keith [name] MRN: [id number] DOB: 2000-11-17  Today's TOC FU Call Status: Today's TOC FU Call Status:: Successful TOC FU Call Completed TOC FU Call Complete Date: 09/30/22  Transition Care Management Follow-up Telephone Call Date of Discharge: 09/28/22 Discharge Facility: Other ([facility]) Name [facility]) [facility]: [facility] Type of Discharge: Inpatient Admission Primary Inpatient Discharge Diagnosis:: Double inlet ventricle How have you been since you were released from the hospital?: Better Any questions or concerns?: No  Items Reviewed: Did you receive and understand the discharge instructions provided?: Yes Medications obtained,verified, and reconciled?: Yes (Medications Reviewed) Do you have support at home?: Yes People in Home: parent(s)  Medications Reviewed Today: Medications Reviewed Today   Medications were not reviewed in this encounter     Home Care and Equipment/Supplies: Were Home Health Services Ordered?: No Any new equipment or medical supplies ordered?: No  Functional Questionnaire: Do you need assistance with bathing/showering or dressing?: Yes Do you need assistance with meal preparation?: Yes Do you need assistance with eating?: No Do you have difficulty maintaining continence: Yes Do you need assistance with getting out of bed/getting out of a chair/moving?: Yes Do you have difficulty managing or taking your medications?: Yes  Follow up appointments reviewed: PCP Follow-up appointment confirmed?: Yes Date of PCP follow-up appointment?: 10/07/22 [facility] Follow-up appointment confirmed?: NA Do you need transportation to your follow-up appointment?: No Do you understand care options if your condition(s) worsen?: Yes-patient verbalized understanding    SIGNATURE TB,CMA

## 2022-10-03 ENCOUNTER — Ambulatory Visit (INDEPENDENT_AMBULATORY_CARE_PROVIDER_SITE_OTHER): Payer: Medicare Other | Admitting: Family

## 2022-10-03 ENCOUNTER — Encounter: Payer: Self-pay | Admitting: Family

## 2022-10-03 ENCOUNTER — Other Ambulatory Visit: Payer: Self-pay | Admitting: Family

## 2022-10-03 VITALS — BP 118/68 | HR 62 | Temp 96.9°F | Ht 63.0 in | Wt 168.5 lb

## 2022-10-03 DIAGNOSIS — L03115 Cellulitis of right lower limb: Secondary | ICD-10-CM

## 2022-10-03 DIAGNOSIS — F840 Autistic disorder: Secondary | ICD-10-CM

## 2022-10-03 DIAGNOSIS — F411 Generalized anxiety disorder: Secondary | ICD-10-CM

## 2022-10-03 MED ORDER — LORAZEPAM 2 MG PO TABS: 2.0000 mg | ORAL_TABLET | ORAL | 2 refills | Status: DC | PRN

## 2022-10-03 MED ORDER — CLINDAMYCIN HCL 300 MG PO CAPS
300.0000 mg | ORAL_CAPSULE | Freq: Three times a day (TID) | ORAL | 0 refills | Status: DC
Start: 2022-10-03 — End: 2022-10-06

## 2022-10-03 NOTE — Progress Notes (Unsigned)
Established Patient Hospital follow up Visit Subjective:   Patient ID: Keith [name], male    DOB: Aug 04, 2000  Age: 22 y.o. MRN: [id number]  CC:  Chief Complaint  Patient presents with  . Hospitalization Follow-up    Admitted on 09/22/22 at [facility] Cardiac Stepdown. Started on clindamycin and may need refill.      HPI  Keith [name] is a 22 y.o. male presenting on 10/03/2022 for  hospital follow up.    Hospital: [facility]  Admit: 09/22/22 Discharge: 09/28/22 Discharge QM:VHQIONGEXB MRSA right lower extremity  Discharge medications: clindamycine 300 mg capsules , two capsule TID x 7 days  Went to ER at [facility], and then was transferred to [facility]. Prior to leaving upon assessment started on antibiotics and started on blood thinners, as they were thinking he might have blood clot. (Lovenox and rocephin IV). Right leg xray, negative for fracture initially as well.   When arrived at [facility], RLE swelling and redness with low grade temp.  Echocardiogram, unbalanced atrioventricular canal defect, mild to moderate decreased left ventricular systolic function, mild Atrioventricular VR MRI right tibia fibula w and w/o contrast, no evidence osteomyelitis.   Last metabolic panel Lab Results  Component Value Date   GLUCOSE 131 (H) 09/22/2022   NA 130 (L) 09/22/2022   K 3.7 09/22/2022   CL 100 09/22/2022   CO2 21 (L) 09/22/2022   BUN 20 09/22/2022   CREATININE 0.88 09/22/2022   GFRNONAA >60 09/22/2022   CALCIUM 8.7 (L) 09/22/2022   PROT 6.3 (L) 09/22/2022   ALBUMIN 3.9 09/22/2022   BILITOT 3.8 (H) 09/22/2022   ALKPHOS 138 (H) 09/22/2022   AST 42 (H) 09/22/2022   ALT 51 (H) 09/22/2022   ANIONGAP 9 09/22/2022    -9-9  Acute concerns:  Mom states that the RLE is improving daily, less redness, still with some tenderness with walking. Swelling still there but is improving.      Allergies  Allergen Reactions  . Adhesive [Tape] Itching and Rash    Able to tolerate  paper tape   . Silicone Rash  . Wound Dressing Adhesive Itching and Rash    blisters, Able to tolerate paper tape   ----------------   Social history:  Relevant past medical, surgical, family and social history reviewed and updated as indicated. Interim medical history since our last visit reviewed.  Allergies and medications reviewed and updated.  DATA REVIEWED: CHART IN EPIC    ROS: Negative unless specifically indicated above in HPI.    Current Outpatient Medications:  .  amiodarone (PACERONE) 200 MG tablet, Take 300 mg by mouth daily., Disp: , Rfl:  .  aspirin EC 81 MG tablet, Take 81 mg by mouth daily., Disp: , Rfl:  .  furosemide (LASIX) 10 MG/ML solution, Take 10 mg by mouth 2 (two) times daily., Disp: , Rfl:  .  ketoconazole (NIZORAL) 2 % cream, Apply 1 application  topically as needed for irritation., Disp: , Rfl:  .  loratadine (CHILDRENS LORATADINE) 5 MG/5ML syrup, Take 10 mg by mouth daily as needed for allergies. , Disp: , Rfl:  .  pantoprazole sodium (PROTONIX) 40 mg, Place 40 mg into feeding tube daily., Disp: 30 packet, Rfl: 5 .  propranolol (INDERAL) 60 MG tablet, Take 60 mg by mouth 3 (three) times daily., Disp: , Rfl:  .  Sildenafil Citrate 10 MG/ML SUSR, Take 10 mg by mouth 3 (three) times daily., Disp: , Rfl:  .  spironolactone (ALDACTONE) 25  MG tablet, Take 25 mg by mouth daily., Disp: , Rfl: 6 .  traZODone (DESYREL) 100 MG tablet, TAKE 1 TABLET BY MOUTH EVERYDAY AT BEDTIME, Disp: 90 tablet, Rfl: 1 .  Vitamin D3 (VITAMIN D) 25 MCG tablet, Take 1,000 Units by mouth daily., Disp: , Rfl:  .  zinc oxide ([employer] ZINC OXIDE) 20 % ointment, Apply 1 application topically as needed for irritation. , Disp: , Rfl:  .  clindamycin (CLEOCIN) 300 MG capsule, Take 1 capsule (300 mg total) by mouth 3 (three) times daily for 7 days., Disp: 21 capsule, Rfl: 0 .  LORazepam (ATIVAN) 2 MG tablet, Take 1 tablet (2 mg total) by mouth as needed for anxiety., Disp: 20 tablet, Rfl: 2 .   potassium chloride 20 MEQ/15ML (10%) SOLN, Take 15 mLs (20 mEq total) by mouth daily for 5 days., Disp: 75 mL, Rfl: 0      Objective:    BP 118/68   Pulse 62   Temp (!) 96.9 F (36.1 C) (Temporal)   Ht 5\' 3"  (1.6 m)   Wt 168 lb 8 oz (76.4 kg)   SpO2 91%   BMI 29.85 kg/m   Wt Readings from Last 3 Encounters:  10/03/22 168 lb 8 oz (76.4 kg)  09/22/22 164 lb 14.5 oz (74.8 kg)  07/10/22 160 lb 12.8 oz (72.9 kg)    Physical Exam        Assessment & Plan:  Cellulitis of right lower extremity -     Clindamycin HCl; Take 1 capsule (300 mg total) by mouth 3 (three) times daily for 7 days.  Dispense: 21 capsule; Refill: 0  Autism spectrum disorder -     LORazepam; Take 1 tablet (2 mg total) by mouth as needed for anxiety.  Dispense: 20 tablet; Refill: 2  Anxiety state -     LORazepam; Take 1 tablet (2 mg total) by mouth as needed for anxiety.  Dispense: 20 tablet; Refill: 2     No follow-ups on file.  Mort Sawyers, FNP

## 2022-10-06 ENCOUNTER — Encounter: Payer: Self-pay | Admitting: Family

## 2022-10-06 ENCOUNTER — Ambulatory Visit (INDEPENDENT_AMBULATORY_CARE_PROVIDER_SITE_OTHER): Payer: Medicare Other | Admitting: Family

## 2022-10-06 VITALS — BP 98/62 | HR 61 | Ht 63.0 in | Wt 168.4 lb

## 2022-10-06 DIAGNOSIS — L03115 Cellulitis of right lower limb: Secondary | ICD-10-CM | POA: Diagnosis not present

## 2022-10-06 MED ORDER — CLINDAMYCIN HCL 300 MG PO CAPS
600.0000 mg | ORAL_CAPSULE | Freq: Three times a day (TID) | ORAL | 0 refills | Status: AC
Start: 2022-10-06 — End: 2022-10-13

## 2022-10-06 NOTE — Assessment & Plan Note (Signed)
Continue clindamycin therapy for seven more days past current duration Rx sent for 300 mg tid x 7 days as worry for CDIFF  Advised mom to monitor for worsening s/s infection and or s/s cdiff

## 2022-10-06 NOTE — Progress Notes (Signed)
Established Patient Office Visit  Subjective:      CC:  Chief Complaint  Patient presents with   Medical Management of Chronic Issues    Follow up on cellulitis    HPI: Keith [name] is a 22 y.o. male presenting on 10/06/2022 for Medical Management of Chronic Issues (Follow up on cellulitis) . Mom accompanying patient today, patient is in good spirits. Mom states she has noticed some increased redness to his right lower extremity three days ago. She does state however this am she noticed a slight improvement. He is currently on clindamycin 600 mg tid.   Vancomycin 1.5 g x 1 and cefepime 2 g x1 in ED 7/29.  09/23/22 started cefazolin IV Clindamycin started IV 09/25/22, currently on day 11 clindamycin.  MRI in ER 8/2 neg for deeper infection and or osteomyelitis  Due to improvement after addition of clindamycin they suspected MRSA vs MSSA, was d/c cefazolin inpatient and continued treatment with clindamycin. Was discharged with a seven day course of clindamycin on 8/4, He is currently on day 11 of total clindamycin treatment.     Social history:  Relevant past medical, surgical, family and social history reviewed and updated as indicated. Interim medical history since our last visit reviewed.  Allergies and medications reviewed and updated.  DATA REVIEWED: CHART IN EPIC     ROS: Negative unless specifically indicated above in HPI.    Current Outpatient Medications:    amiodarone (PACERONE) 200 MG tablet, Take 300 mg by mouth daily., Disp: , Rfl:    aspirin EC 81 MG tablet, Take 81 mg by mouth daily., Disp: , Rfl:    furosemide (LASIX) 10 MG/ML solution, Take 10 mg by mouth 2 (two) times daily., Disp: , Rfl:    ketoconazole (NIZORAL) 2 % cream, Apply 1 application  topically as needed for irritation., Disp: , Rfl:    loratadine (CHILDRENS LORATADINE) 5 MG/5ML syrup, Take 10 mg by mouth daily as needed for allergies. , Disp: , Rfl:    LORazepam (ATIVAN) 2 MG tablet,  Take 1 tablet (2 mg total) by mouth as needed for anxiety., Disp: 20 tablet, Rfl: 2   pantoprazole sodium (PROTONIX) 40 mg, Place 40 mg into feeding tube daily., Disp: 30 packet, Rfl: 5   propranolol (INDERAL) 60 MG tablet, Take 60 mg by mouth 3 (three) times daily., Disp: , Rfl:    Sildenafil Citrate 10 MG/ML SUSR, Take 10 mg by mouth 3 (three) times daily., Disp: , Rfl:    spironolactone (ALDACTONE) 25 MG tablet, Take 25 mg by mouth daily., Disp: , Rfl: 6   traZODone (DESYREL) 100 MG tablet, TAKE 1 TABLET BY MOUTH EVERYDAY AT BEDTIME, Disp: 90 tablet, Rfl: 1   Vitamin D3 (VITAMIN D) 25 MCG tablet, Take 1,000 Units by mouth daily., Disp: , Rfl:    zinc oxide (CVS ZINC OXIDE) 20 % ointment, Apply 1 application topically as needed for irritation. , Disp: , Rfl:    clindamycin (CLEOCIN) 300 MG capsule, Take 2 capsules (600 mg total) by mouth 3 (three) times daily for 7 days., Disp: 42 capsule, Rfl: 0   potassium chloride 20 MEQ/15ML (10%) SOLN, Take 15 mLs (20 mEq total) by mouth daily for 5 days., Disp: 75 mL, Rfl: 0      Objective:    BP 98/62   Pulse 61   Ht 5\' 3"  (1.6 m)   Wt 168 lb 6.4 oz (76.4 kg)   SpO2 94%   BMI 29.83 kg/m  Wt Readings from Last 3 Encounters:  10/06/22 168 lb 6.4 oz (76.4 kg)  10/03/22 168 lb 8 oz (76.4 kg)  09/22/22 164 lb 14.5 oz (74.8 kg)    Physical Exam Skin:    Findings: Erythema (right lower extremity with erythema and warmth to site from upper 2/3 of shin down to foot. non pitting edema, edema very mild 1. nontender on palpation) present.  Psychiatric:        Attention and Perception: He is inattentive.        Speech: He is noncommunicative.        Behavior: Behavior normal.             Assessment & Plan:  Cellulitis of right lower extremity Assessment & Plan: Increase duration of treatment for seven more days of clindamycin 600 mg tid.  Monitor for s/s CDIFF  Referral  placed for infectious disease for further eval treat as cellulitis  has hit a stagnant point.  Red flag cautions given to mom, if increasing erythema and or change in pt mental status go to er   Orders: -     Ambulatory referral to Infectious Disease -     Clindamycin HCl; Take 2 capsules (600 mg total) by mouth 3 (three) times daily for 7 days.  Dispense: 42 capsule; Refill: 0     Return in about 1 week (around 10/13/2022) for f/u wound if has not yet seen infectious disease ,.  Mort Sawyers, MSN, APRN, FNP-C [facility]

## 2022-10-06 NOTE — Assessment & Plan Note (Signed)
Increase duration of treatment for seven more days of clindamycin 600 mg tid.  Monitor for s/s CDIFF  Referral  placed for infectious disease for further eval treat as cellulitis has hit a stagnant point.  Red flag cautions given to mom, if increasing erythema and or change in pt mental status go to er

## 2022-10-06 NOTE — Assessment & Plan Note (Signed)
Pdmp reviewed Refill sent for lorazepam 2 mg prn anxiety

## 2022-10-07 ENCOUNTER — Ambulatory Visit: Payer: Medicare Other | Admitting: Family

## 2022-10-07 ENCOUNTER — Encounter: Payer: Self-pay | Admitting: Family

## 2022-10-08 NOTE — Telephone Encounter (Signed)
Could we change referral to urgent/ with pediatric infectious disease with [facility]? See message from mom below. She is very familiar with [facility] due to pt being frequently seen in the hospital.   [facility] Infectious Diseases clinic

## 2022-10-08 NOTE — Telephone Encounter (Signed)
Patient called regarding this request, asked if there was any way a referral could be placed for [facility] Infectious Diseases clinic? Mom says patient was seen by pediatrics when he was in the hospital and they would not mind seeing pediatric if it could get them in sooner. Soonest appointment with other office is 9/9

## 2022-10-08 NOTE — Telephone Encounter (Signed)
Is there anyway we can call another location to see if pt can be seen more urgently?

## 2022-10-09 ENCOUNTER — Telehealth: Payer: Self-pay | Admitting: Family

## 2022-10-09 DIAGNOSIS — Z79899 Other long term (current) drug therapy: Secondary | ICD-10-CM

## 2022-10-09 NOTE — Telephone Encounter (Signed)
Thank you so much Danae Chen!!

## 2022-10-09 NOTE — Telephone Encounter (Signed)
Pt's mom, [name], called requesting orders to be submitted for pt to have his potassium checked? [name] stated the pt was told the antibiotic he was put on may interact with spironolactone (ALDACTONE) 25 MG tablet & suggested the pt get potassium checked. [name] stated the pt would like to get labs done on 8/20, if possible. Call back # [phone]

## 2022-10-10 ENCOUNTER — Ambulatory Visit: Payer: Medicare Other

## 2022-10-10 NOTE — Telephone Encounter (Signed)
If okay I will place the order.

## 2022-10-14 ENCOUNTER — Ambulatory Visit: Payer: Medicare Other

## 2022-10-14 ENCOUNTER — Ambulatory Visit: Payer: Medicare Other | Admitting: Family

## 2022-10-14 ENCOUNTER — Other Ambulatory Visit (INDEPENDENT_AMBULATORY_CARE_PROVIDER_SITE_OTHER): Payer: Medicare Other

## 2022-10-14 DIAGNOSIS — Z79899 Other long term (current) drug therapy: Secondary | ICD-10-CM | POA: Diagnosis not present

## 2022-10-14 LAB — POTASSIUM: Potassium: 4.6 mEq/L (ref 3.5–5.1)

## 2022-10-16 ENCOUNTER — Ambulatory Visit (INDEPENDENT_AMBULATORY_CARE_PROVIDER_SITE_OTHER): Payer: Medicare Other

## 2022-10-16 ENCOUNTER — Ambulatory Visit: Payer: Medicare Other | Admitting: Family

## 2022-10-16 VITALS — Wt 168.0 lb

## 2022-10-16 DIAGNOSIS — Z0000 Encounter for general adult medical examination without abnormal findings: Secondary | ICD-10-CM | POA: Diagnosis not present

## 2022-10-16 NOTE — Patient Instructions (Signed)
Keith [name] , Thank you for taking time to come for your Medicare Wellness Visit. I appreciate your ongoing commitment to your health goals. Please review the following plan we discussed and let me know if I can assist you in the future.   Referrals/Orders/Follow-Ups/Clinician Recommendations: be as independent ans he can   This is a list of the screening recommended for you and due dates:  Health Maintenance  Topic Date Due   Medicare Annual Wellness Visit  Never done   COVID-19 Vaccine (1) Never done   HPV Vaccine (1 - Male 3-dose series) Never done   HIV Screening  Never done   Hepatitis C Screening  Never done   Flu Shot  05/25/2023*   DTaP/Tdap/Td vaccine (2 - Td or Tdap) 10/12/2028  *Topic was postponed. The date shown is not the original due date.    Advanced directives: (Declined) Advance directive discussed with you today. Even though you declined this today, please call our office should you change your mind, and we can give you the proper paperwork for you to fill out.  Next Medicare Annual Wellness Visit scheduled for next year: Yes

## 2022-10-16 NOTE — Progress Notes (Signed)
Subjective:   Keith [name] is a 22 y.o. male who presents for an Initial Medicare Annual Wellness Visit.Mother Keith [name] completed pt is non verbal   Visit Complete: Virtual  I connected with  MOUHAMAD AZZARELLO on 10/16/22 by a audio enabled telemedicine application and verified that I am speaking with the correct person using two identifiers.  Patient Location: Home  Provider Location: Office/Clinic  I discussed the limitations of evaluation and management by telemedicine. The patient expressed understanding and agreed to proceed.    Vital Signs: Unable to obtain new vitals due to this being a telehealth visit.   Review of Systems     Cardiac Risk Factors include: male gender     Objective:    Today's Vitals   10/16/22 1556  Weight: 168 lb (76.2 kg)   Body mass index is 29.76 kg/m.     10/16/2022    4:02 PM 09/22/2022   12:40 AM 02/02/2018   11:43 AM  Advanced Directives  Does Patient Have a Medical Advance Directive? No No No  Would patient like information on creating a medical advance directive? No - Patient declined No - Patient declined     Current Medications (verified) Outpatient Encounter Medications as of 10/16/2022  Medication Sig   amiodarone (PACERONE) 200 MG tablet Take 300 mg by mouth daily.   aspirin EC 81 MG tablet Take 81 mg by mouth daily.   furosemide (LASIX) 10 MG/ML solution Take 10 mg by mouth 2 (two) times daily.   ketoconazole (NIZORAL) 2 % cream Apply 1 application  topically as needed for irritation.   loratadine (CHILDRENS LORATADINE) 5 MG/5ML syrup Take 10 mg by mouth daily as needed for allergies.    LORazepam (ATIVAN) 2 MG tablet Take 1 tablet (2 mg total) by mouth as needed for anxiety.   pantoprazole sodium (PROTONIX) 40 mg Place 40 mg into feeding tube daily.   propranolol (INDERAL) 60 MG tablet Take 60 mg by mouth 3 (three) times daily.   Sildenafil Citrate 10 MG/ML SUSR Take 10 mg by mouth 3 (three) times daily.   spironolactone  (ALDACTONE) 25 MG tablet Take 25 mg by mouth daily.   sulfamethoxazole-trimethoprim (BACTRIM DS) 800-160 MG tablet Take 1 tablet by mouth 2 (two) times daily.   traZODone (DESYREL) 100 MG tablet TAKE 1 TABLET BY MOUTH EVERYDAY AT BEDTIME   Vitamin D3 (VITAMIN D) 25 MCG tablet Take 1,000 Units by mouth daily.   zinc oxide ([employer]S ZINC OXIDE) 20 % ointment Apply 1 application topically as needed for irritation.    potassium chloride 20 MEQ/15ML (10%) SOLN Take 15 mLs (20 mEq total) by mouth daily for 5 days.   No facility-administered encounter medications on file as of 10/16/2022.    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   SVT (supraventricular tachycardia)    previously 3x before, total of 4 including today 05/08/15   Ventricular fibrillation (HCC)    inducible during EP study Dr Selmer [place], [facility] 06/21/2015   VSD (ventricular septal defect)    Past Surgical History:  Procedure Laterality Date   CARDIAC CATHETERIZATION     CYSTOURETHROSCOPY     x 2, 07/30/2016 and 01/16/2020   FONTAN PROCEDURE, EXTRACARDIAC  05/28/2015   ICD IMPLANT     defib   PACEMAKER IMPLANT     UPPER GI ENDOSCOPY  02/25/2015   for bleeding ulcer    VSD REPAIR     3 surgeries -one was a bypass, one was a shunt and fontan procedure    WISDOM TOOTH EXTRACTION     Family History  Problem Relation Age of Onset   Prostate cancer Father        age 98   ALS Maternal Grandmother    Heart disease Maternal Grandmother    Heart disease Maternal Grandfather    Throat cancer Paternal Grandfather    Social History   Socioeconomic History   Marital status: Single    Spouse name: Not on file   Number of children: 3   Years of education: Not on file   Highest education  level: Not on file  Occupational History   Not on file  Tobacco Use   Smoking status: Never   Smokeless tobacco: Never  Vaping Use   Vaping status: Never Used  Substance and Sexual Activity   Alcohol use: No   Drug use: No   Sexual activity: Never  Other Topics Concern   Not on file  Social History Narrative   Not on file   Social Determinants of Health   Financial Resource Strain: Low Risk  (10/16/2022)   Overall Financial Resource Strain (CARDIA)    Difficulty of Paying Living Expenses: Not hard at all  Food Insecurity: No Food Insecurity (10/16/2022)   Hunger Vital Sign    Worried About Running Out of Food in the Last Year: Never true    Ran Out of Food in the Last Year: Never true  Transportation Needs: No Transportation Needs (10/16/2022)   PRAPARE - Administrator, Civil Service (Medical): No    Lack of Transportation (Non-Medical): No  Physical Activity: Inactive (10/16/2022)   Exercise Vital Sign    Days of Exercise per Week: 0 days    Minutes of Exercise per Session: 0 min  Stress: No Stress Concern Present (10/16/2022)   [employer] of Occupational Health - Occupational Stress Questionnaire    Feeling of Stress : Not at all  Social Connections: Socially Isolated (10/16/2022)   Social Connection and Isolation Panel [NHANES]    Frequency of Communication with Friends and Family: Never    Frequency of Social Gatherings with Friends and Family: Three times a week    Attends Religious Services: Never    Active Member of Clubs or Organizations: No    Attends Banker Meetings: Never    Marital Status: Never married    Tobacco Counseling Counseling given: Not Answered   Clinical Intake:  Pre-visit preparation completed: Yes  Pain : No/denies pain     BMI - recorded: 29.76 Nutritional Status: BMI 25 -29 Overweight Nutritional Risks: None Diabetes: No  How often do you need to have someone help you when you read instructions,  pamphlets, or other written materials from your doctor or pharmacy?: 1 - Never  Interpreter Needed?: No  Information entered by :: Lanier Ensign, LPN   Activities of Daily Living    10/16/2022    3:58 PM  In your present state of health, do you have any difficulty performing the following activities:  Hearing? 0  Vision? 0  Difficulty concentrating or making decisions? 0  Comment no verbal  Walking or climbing stairs? 0  Comment no stairs in the home  Dressing  or bathing? 1  Doing errands, shopping? 1  Comment mom assist  Preparing Food and eating ? Y  Comment mom assist  Using the Toilet? Y  In the past six months, have you accidently leaked urine? Y  Comment wears a brief at times  Do you have problems with loss of bowel control? Y  Managing your Medications? Y  Managing your Finances? Y  Comment mom assist  Housekeeping or managing your Housekeeping? Y  Comment mom assist    Patient Care Team: Mort Sawyers, FNP as PCP - General (Family Medicine)  Indicate any recent Medical Services you may have received from other than [facility] providers in the past year (date may be approximate).     Assessment:   This is a routine wellness examination for Demarius.  Hearing/Vision screen Hearing Screening - Comments:: Pt denies any hearing issues Vision Screening - Comments:: Declined   Dietary issues and exercise activities discussed:     Goals Addressed             This Visit's Progress    Patient Stated       Be as independent as he can        Depression Screen    10/16/2022    4:02 PM 07/10/2022    7:39 AM 10/09/2021   11:58 AM  PHQ 2/9 Scores  PHQ - 2 Score 0 0   Exception Documentation   Medical reason    Fall Risk    10/16/2022    4:05 PM 07/10/2022    7:39 AM  Fall Risk   Falls in the past year? 0 0  Number falls in past yr: 0 0  Injury with Fall? 0 0  Risk for fall due to : Impaired mobility   Risk for fall due to: Comment non ambulatory    Follow up Falls prevention discussed Falls evaluation completed;Education provided;Falls prevention discussed    MEDICARE RISK AT HOME: Medicare Risk at Home Any stairs in or around the home?: No If so, are there any without handrails?: No Home free of loose throw rugs in walkways, pet beds, electrical cords, etc?: Yes Adequate lighting in your home to reduce risk of falls?: Yes Life alert?: No Use of a cane, walker or w/c?: Yes Grab bars in the bathroom?: No Shower chair or bench in shower?: No Elevated toilet seat or a handicapped toilet?: No  TIMED UP AND GO:  Was the test performed? No    Cognitive Function:pt non verbal         Immunizations Immunization History  Administered Date(s) Administered   Influenza, High Dose Seasonal PF 01/15/2017   Influenza,inj,Quad PF,6+ Mos 12/14/2018   Influenza-Unspecified 01/03/2016, 11/13/2021   Tdap 10/13/2018    TDAP status: Up to date  Flu Vaccine status: Due, Education has been provided regarding the importance of this vaccine. Advised may receive this vaccine at local pharmacy or Health Dept. Aware to provide a copy of the vaccination record if obtained from local pharmacy or Health Dept. Verbalized acceptance and understanding.    Covid-19 vaccine status: Information provided on how to obtain vaccines.   Qualifies for Shingles Vaccine? No      Screening Tests Health Maintenance  Topic Date Due   COVID-19 Vaccine (1) Never done   HPV VACCINES (1 - Male 3-dose series) Never done   HIV Screening  Never done   Hepatitis C Screening  Never done   INFLUENZA VACCINE  05/25/2023 (Originally 09/25/2022)   Medicare Annual Wellness (  AWV)  10/16/2023   DTaP/Tdap/Td (2 - Td or Tdap) 10/12/2028    Health Maintenance  Health Maintenance Due  Topic Date Due   COVID-19 Vaccine (1) Never done   HPV VACCINES (1 - Male 3-dose series) Never done   HIV Screening  Never done   Hepatitis C Screening  Never done        Additional Screening:   Vision Screening: Recommended annual ophthalmology exams for early detection of glaucoma and other disorders of the eye. Is the patient up to date with their annual eye exam?  No  Who is the provider or what is the name of the office in which the patient attends annual eye exams? Declined  If pt is not established with a provider, would they like to be referred to a provider to establish care? No .   Dental Screening: Recommended annual dental exams for proper oral hygiene   Community Resource Referral / Chronic Care Management: CRR required this visit?  No   CCM required this visit?  No    Plan:     I have personally reviewed and noted the following in the patient's chart:   Medical and social history Use of alcohol, tobacco or illicit drugs  Current medications and supplements including opioid prescriptions. Patient is not currently taking opioid prescriptions. Functional ability and status Nutritional status Physical activity Advanced directives List of other physicians Hospitalizations, surgeries, and ER visits in previous 12 months Vitals Screenings to include cognitive, depression, and falls Referrals and appointments  In addition, I have reviewed and discussed with patient certain preventive protocols, quality metrics, and best practice recommendations. A written personalized care plan for preventive services as well as general preventive health recommendations were provided to patient.     Marzella Schlein, LPN   10/23/5619   After Visit Summary: (MyChart) Due to this being a telephonic visit, the after visit summary with patients personalized plan was offered to patient via MyChart   Nurse Notes: Pt is non verbal unable to completed cognition testing

## 2022-10-29 ENCOUNTER — Encounter: Payer: Self-pay | Admitting: Family

## 2022-11-04 ENCOUNTER — Telehealth: Payer: Medicare Other | Admitting: Physician Assistant

## 2022-11-04 DIAGNOSIS — L739 Follicular disorder, unspecified: Secondary | ICD-10-CM | POA: Diagnosis not present

## 2022-11-04 MED ORDER — DOXYCYCLINE HYCLATE 100 MG PO TABS: 100.0000 mg | ORAL_TABLET | Freq: Two times a day (BID) | ORAL | 0 refills | Status: DC

## 2022-11-04 NOTE — Progress Notes (Signed)
Based on what you have shared with me you have a folliculitis or infection at base of hair follicles/sweat glands. I want you to keep skin clean and dry. Avoid any new or recently changed soaps, lotions or other products applied to the face. I have prescribed Doxycycline 100 mg to take twice daily for 10 days. Please schedule a follow-up with your PCP.  Get help right away if you note any acute worsening of symptoms despite treatment, or new onset of fever.

## 2022-11-04 NOTE — Progress Notes (Signed)
I have spent 5 minutes in review of e-visit questionnaire, review and updating patient chart, medical decision making and response to patient.   William Cody Martin, [name]    

## 2022-11-12 ENCOUNTER — Ambulatory Visit: Payer: Medicare Other | Admitting: Family

## 2022-11-14 ENCOUNTER — Other Ambulatory Visit: Payer: Self-pay | Admitting: Family

## 2022-11-14 ENCOUNTER — Ambulatory Visit (INDEPENDENT_AMBULATORY_CARE_PROVIDER_SITE_OTHER): Payer: Medicare Other | Admitting: Family

## 2022-11-14 VITALS — BP 120/72 | HR 78 | Temp 97.0°F | Ht 63.5 in | Wt 153.2 lb

## 2022-11-14 DIAGNOSIS — Z23 Encounter for immunization: Secondary | ICD-10-CM

## 2022-11-14 DIAGNOSIS — R21 Rash and other nonspecific skin eruption: Secondary | ICD-10-CM | POA: Diagnosis not present

## 2022-11-14 DIAGNOSIS — L239 Allergic contact dermatitis, unspecified cause: Secondary | ICD-10-CM | POA: Insufficient documentation

## 2022-11-14 DIAGNOSIS — F411 Generalized anxiety disorder: Secondary | ICD-10-CM | POA: Diagnosis not present

## 2022-11-14 DIAGNOSIS — F840 Autistic disorder: Secondary | ICD-10-CM | POA: Diagnosis not present

## 2022-11-14 MED ORDER — DESONIDE 0.05 % EX CREA
TOPICAL_CREAM | Freq: Two times a day (BID) | CUTANEOUS | 0 refills | Status: AC
Start: 2022-11-14 — End: ?

## 2022-11-14 MED ORDER — LORAZEPAM 2 MG PO TABS
2.0000 mg | ORAL_TABLET | ORAL | 2 refills | Status: AC | PRN
Start: 2022-11-14 — End: ?

## 2022-11-14 NOTE — Assessment & Plan Note (Signed)
Rx desonide for face advised on applying thin coating twice daily prn rash  May need to resort to oral prednisone however will await to see if cream and cessation of suspected causative agent (bactrim) improves the rash. Advised pt mom to reach out to ID at [facility] to advise suspected allergy to bactrim, and provide alternative therapy if necessary.

## 2022-11-14 NOTE — Progress Notes (Signed)
Established Patient Office Visit  Subjective:   Patient ID: Keith [name], male    DOB: 2000/09/18  Age: 22 y.o. MRN: [id number]  CC:  Chief Complaint  Patient presents with   Rash    Was seen for a virtual visit last week and was given an antibiotic and is almost done with it but the rash is not clearing up.     HPI: Keith [name] is a 22 y.o. male presenting on 11/14/2022 for Rash (Was seen for a virtual visit last week and was given an antibiotic and is almost done with it but the rash is not clearing up. )  Currently taking bactrim every other day, they want him to take this every other day for his T cell to improve per infectious disease. She states she has noticed the rash slowly start to getting worse since starting this medication.  She says he doesn't appear to itch it but it is not improving. Moms states slightly worse over the last three days. Mom states it is also on his upper back.   Was seen 9/10 for skin rash as well, given doxycycline 100 mg twice daily for 10 days this is not helping. Dx with folliculitis but this was an E visit , pt did not have a physical exam.       ROS: Negative unless specifically indicated above in HPI.   Relevant past medical history reviewed and updated as indicated.   Allergies and medications reviewed and updated.   Current Outpatient Medications:    amiodarone (PACERONE) 200 MG tablet, Take 300 mg by mouth daily., Disp: , Rfl:    aspirin EC 81 MG tablet, Take 81 mg by mouth daily., Disp: , Rfl:    desonide (DESOWEN) 0.05 % cream, Apply topically 2 (two) times daily., Disp: 30 g, Rfl: 0   doxycycline (VIBRA-TABS) 100 MG tablet, Take 1 tablet (100 mg total) by mouth 2 (two) times daily., Disp: 20 tablet, Rfl: 0   furosemide (LASIX) 10 MG/ML solution, Take 10 mg by mouth 2 (two) times daily., Disp: , Rfl:    loratadine (CHILDRENS LORATADINE) 5 MG/5ML syrup, Take 10 mg by mouth daily as needed for allergies. , Disp: , Rfl:     propranolol (INDERAL) 60 MG tablet, Take 60 mg by mouth 3 (three) times daily., Disp: , Rfl:    Sildenafil Citrate 10 MG/ML SUSR, Take 10 mg by mouth 3 (three) times daily., Disp: , Rfl:    spironolactone (ALDACTONE) 25 MG tablet, Take 25 mg by mouth daily., Disp: , Rfl: 6   traZODone (DESYREL) 100 MG tablet, TAKE 1 TABLET BY MOUTH EVERYDAY AT BEDTIME, Disp: 90 tablet, Rfl: 1   Vitamin D3 (VITAMIN D) 25 MCG tablet, Take 1,000 Units by mouth daily., Disp: , Rfl:    LORazepam (ATIVAN) 2 MG tablet, Take 1 tablet (2 mg total) by mouth as needed for anxiety., Disp: 20 tablet, Rfl: 2  Allergies  Allergen Reactions   Adhesive [Tape] Itching and Rash    Able to tolerate paper tape    Silicone Rash   Wound Dressing Adhesive Itching and Rash    blisters, Able to tolerate paper tape    Objective:   BP 120/72   Pulse 78   Temp (!) 97 F (36.1 C) (Temporal)   Ht 5' 3.5" (1.613 m)   Wt 153 lb 3.2 oz (69.5 kg)   SpO2 92%   BMI 26.71 kg/m    Physical Exam Vitals and nursing  note reviewed.  Constitutional:      General: He is not in acute distress.    Appearance: Normal appearance. He is normal weight. He is not ill-appearing, toxic-appearing or diaphoretic.  Cardiovascular:     Rate and Rhythm: Normal rate.  Pulmonary:     Effort: Pulmonary effort is normal.  Musculoskeletal:        General: Normal range of motion.  Skin:    Comments: Raised papular rash with scaly texture on all aspects of face as well as scant localized similar rash on mid upper back.   Neurological:     General: No focal deficit present.     Mental Status: He is alert and oriented to person, place, and time. Mental status is at baseline.  Psychiatric:        Mood and Affect: Mood normal.        Behavior: Behavior normal.        Thought Content: Thought content normal.        Judgment: Judgment normal.        Assessment & Plan:  Facial rash -     Desonide; Apply topically 2 (two) times daily.  Dispense: 30  g; Refill: 0  Allergic dermatitis Assessment & Plan: Rx desonide for face advised on applying thin coating twice daily prn rash  May need to resort to oral prednisone however will await to see if cream and cessation of suspected causative agent (bactrim) improves the rash. Advised pt mom to reach out to ID at [facility] to advise suspected allergy to bactrim, and provide alternative therapy if necessary.   Orders: -     Desonide; Apply topically 2 (two) times daily.  Dispense: 30 g; Refill: 0  Autism spectrum disorder -     LORazepam; Take 1 tablet (2 mg total) by mouth as needed for anxiety.  Dispense: 20 tablet; Refill: 2  Anxiety state -     LORazepam; Take 1 tablet (2 mg total) by mouth as needed for anxiety.  Dispense: 20 tablet; Refill: 2     Follow up plan: Return if symptoms worsen or fail to improve.  Mort Sawyers, FNP

## 2022-11-19 ENCOUNTER — Encounter: Payer: Self-pay | Admitting: Family

## 2022-11-19 DIAGNOSIS — R21 Rash and other nonspecific skin eruption: Secondary | ICD-10-CM

## 2022-11-20 NOTE — Addendum Note (Signed)
Addended by: Mort Sawyers on: 11/20/2022 03:35 PM   Modules accepted: Orders

## 2022-11-20 NOTE — Telephone Encounter (Signed)
Can we call this office with this provider and inquire if they would be ok with pt getting short burst steroids with his cardiac comorbidites? He has a rash on his face that refuses to improve and have tried RX steroids without luck. Suspected dermatitis.   Liana Gerold, NP  [address]  [place], [place] [address]  [phone] (Work)  [phone] (Fax)

## 2022-11-25 NOTE — Addendum Note (Signed)
Addended by: Mort Sawyers on: 11/25/2022 03:47 PM   Modules accepted: Orders

## 2022-12-23 ENCOUNTER — Ambulatory Visit (INDEPENDENT_AMBULATORY_CARE_PROVIDER_SITE_OTHER): Payer: Medicare Other | Admitting: Family

## 2022-12-23 ENCOUNTER — Encounter: Payer: Self-pay | Admitting: Family

## 2022-12-23 VITALS — BP 112/72 | HR 78 | Temp 97.8°F | Ht 63.5 in | Wt 156.2 lb

## 2022-12-23 DIAGNOSIS — Z20822 Contact with and (suspected) exposure to covid-19: Secondary | ICD-10-CM | POA: Diagnosis not present

## 2022-12-23 DIAGNOSIS — J029 Acute pharyngitis, unspecified: Secondary | ICD-10-CM

## 2022-12-23 DIAGNOSIS — J0100 Acute maxillary sinusitis, unspecified: Secondary | ICD-10-CM | POA: Insufficient documentation

## 2022-12-23 LAB — POCT RAPID STREP A (OFFICE): Rapid Strep A Screen: NEGATIVE

## 2022-12-23 LAB — POC COVID19 BINAXNOW: SARS Coronavirus 2 Ag: NEGATIVE

## 2022-12-23 MED ORDER — CEFDINIR 300 MG PO CAPS: 300.0000 mg | ORAL_CAPSULE | Freq: Two times a day (BID) | ORAL | 0 refills | Status: AC

## 2022-12-23 NOTE — Progress Notes (Signed)
Established Patient Office Visit  Subjective:   Patient ID: Keith [name], male    DOB: 23-Dec-2000  Age: 22 y.o. MRN: [id number]  CC:  Chief Complaint  Patient presents with   Acute Visit    Mother states that pt has lots of sinus congestion. Pt has been tested for COVID twice and both tests were negative.    HPI: Keith [name] is a 22 y.o. male presenting on 12/23/2022 for Acute Visit (Mother states that pt has lots of sinus congestion. Pt has been tested for COVID twice and both tests were negative.)  Mom states five days ago started not feeling well, has been very congested nasally. She states he has a dry cough and also very fatigue.  It was hard for him to sleep, he sounds hoarse. She has been giving him guaifenesin only. Mom states no wheezing.  Covid tested at home negative however she thinks that she might have had a bad test because looked faintly positive. Mom has noticed he is 'mouth breathing'       ROS: Negative unless specifically indicated above in HPI.   Relevant past medical history reviewed and updated as indicated.   Allergies and medications reviewed and updated.   Current Outpatient Medications:    amiodarone (PACERONE) 200 MG tablet, Take 300 mg by mouth daily., Disp: , Rfl:    aspirin EC 81 MG tablet, Take 81 mg by mouth daily., Disp: , Rfl:    cefdinir (OMNICEF) 300 MG capsule, Take 1 capsule (300 mg total) by mouth 2 (two) times daily for 7 days., Disp: 14 capsule, Rfl: 0   desonide (DESOWEN) 0.05 % cream, Apply topically 2 (two) times daily., Disp: 30 g, Rfl: 0   furosemide (LASIX) 10 MG/ML solution, Take 10 mg by mouth 2 (two) times daily., Disp: , Rfl:    loratadine (CHILDRENS LORATADINE) 5 MG/5ML syrup, Take 10 mg by mouth daily as needed for allergies. , Disp: , Rfl:    LORazepam (ATIVAN) 2 MG tablet, Take 1 tablet (2 mg total) by mouth as needed for anxiety., Disp: 20 tablet, Rfl: 2   propranolol (INDERAL) 60 MG tablet, Take 60 mg by  mouth 3 (three) times daily., Disp: , Rfl:    Sildenafil Citrate 10 MG/ML SUSR, Take 10 mg by mouth 3 (three) times daily., Disp: , Rfl:    spironolactone (ALDACTONE) 25 MG tablet, Take 25 mg by mouth daily., Disp: , Rfl: 6   traZODone (DESYREL) 100 MG tablet, TAKE 1 TABLET BY MOUTH EVERYDAY AT BEDTIME, Disp: 90 tablet, Rfl: 1   Vitamin D3 (VITAMIN D) 25 MCG tablet, Take 1,000 Units by mouth daily., Disp: , Rfl:   Allergies  Allergen Reactions   Adhesive [Tape] Itching and Rash    Able to tolerate paper tape    Silicone Rash   Wound Dressing Adhesive Itching and Rash    blisters, Able to tolerate paper tape    Objective:   BP 112/72   Pulse 78   Temp 97.8 F (36.6 C) (Temporal)   Ht 5' 3.5" (1.613 m)   Wt 156 lb 3.2 oz (70.9 kg)   SpO2 90%   BMI 27.24 kg/m    Physical Exam Vitals reviewed.  Constitutional:      General: He is not in acute distress.    Appearance: Normal appearance. He is obese. He is not ill-appearing, toxic-appearing or diaphoretic.  HENT:     Head: Normocephalic.     Right Ear: A middle  ear effusion (clear) is present.     Left Ear: Tympanic membrane normal. There is impacted cerumen (unable to visualize TM).     Ears:     Comments: Mild swelling right ear canal    Nose: Nose normal.     Mouth/Throat:     Mouth: Mucous membranes are moist.     Pharynx: No posterior oropharyngeal erythema (unable to fully examine, pt did not follow instruction to fully open mouth).     Comments: Slightly combative with hands when trying to see inside mouth so did not utilize tongue depressor Eyes:     Pupils: Pupils are equal, round, and reactive to light.  Cardiovascular:     Rate and Rhythm: Normal rate and regular rhythm.  Pulmonary:     Effort: Pulmonary effort is normal.     Breath sounds: Normal breath sounds. No wheezing.     Comments: Unable to fully assess lung sounds, pt non verbal and does not respond to cues, but with small breaths did not hear any  abnormalities Musculoskeletal:        General: Normal range of motion.     Cervical back: Normal range of motion.  Lymphadenopathy:     Cervical: Cervical adenopathy present.     Right cervical: Superficial cervical adenopathy present.     Left cervical: Superficial cervical adenopathy present.  Neurological:     General: No focal deficit present.     Mental Status: He is alert and oriented to person, place, and time. Mental status is at baseline.  Psychiatric:        Mood and Affect: Mood normal.        Behavior: Behavior normal.        Thought Content: Thought content normal.        Judgment: Judgment normal.     Assessment & Plan:  Sore throat -     POCT rapid strep A  Suspected COVID-19 virus infection -     POC COVID-19 BinaxNow  Acute non-recurrent maxillary sinusitis Assessment & Plan: Prescription given for cefdinir 300 mg po bid x 7 days. Pt to continue tylenol prn sinus pain. Continue with humidifier prn and steam showers recommended as well. instructed If no symptom improvement in 48 hours please f/u  Covid and strep testing negative.   Orders: -     Cefdinir; Take 1 capsule (300 mg total) by mouth 2 (two) times daily for 7 days.  Dispense: 14 capsule; Refill: 0     Follow up plan: Return if symptoms worsen or fail to improve.  Mort Sawyers, FNP

## 2022-12-23 NOTE — Assessment & Plan Note (Addendum)
Prescription given for cefdinir 300 mg po bid x 7 days. Pt to continue tylenol prn sinus pain. Continue with humidifier prn and steam showers recommended as well. instructed If no symptom improvement in 48 hours please f/u  Covid and strep testing negative.

## 2023-01-02 ENCOUNTER — Encounter: Payer: Self-pay | Admitting: Family

## 2023-01-02 DIAGNOSIS — J0100 Acute maxillary sinusitis, unspecified: Secondary | ICD-10-CM

## 2023-01-02 MED ORDER — CEFDINIR 300 MG PO CAPS: 300.0000 mg | ORAL_CAPSULE | Freq: Two times a day (BID) | ORAL | 0 refills | Status: AC

## 2023-01-06 ENCOUNTER — Ambulatory Visit (INDEPENDENT_AMBULATORY_CARE_PROVIDER_SITE_OTHER): Payer: Medicare Other | Admitting: Family

## 2023-01-06 ENCOUNTER — Encounter: Payer: Self-pay | Admitting: Family

## 2023-01-06 VITALS — Temp 98.0°F | Ht 63.5 in | Wt 156.0 lb

## 2023-01-06 DIAGNOSIS — R051 Acute cough: Secondary | ICD-10-CM | POA: Diagnosis not present

## 2023-01-06 MED ORDER — PREDNISONE 20 MG PO TABS: ORAL_TABLET | ORAL | 0 refills | Status: DC

## 2023-01-06 NOTE — Assessment & Plan Note (Signed)
Pt in good spirits however cough continues without resolution and slightly worsening.  Low suspicion for bacterial etiology, suspect viral.  Will give prednisone 40 mg daily x 5 days (approved via message mom showed me from cardiology for short burst), and mom to start tomorrow if symptoms not improving overnight. Will hold on an antibiotic. Can continue mucinex, saline nose spray and vaporizer.

## 2023-01-06 NOTE — Progress Notes (Signed)
Established Patient Office Visit  Subjective:   Patient ID: Keith [name], male    DOB: [birth year]  Age: 22 y.o. MRN: [id number]  CC:  Chief Complaint  Patient presents with   Follow-up    Pt is still having issues with sinus congestion    HPI: Keith [name] is a 22 y.o. male presenting on 01/06/2023 for Follow-up (Pt is still having issues with sinus congestion)  Sinusitis, seen 10/29 for this and was given cefdinir 300 mg po bid x 10 days. Covid and strep testing were negative.   She states he is still congested nasally. Eating well drinking well, acting fine. No fever. 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   Mom is currently giving him claritin once daily, guaifenesin and saline spray.        ROS: Negative unless specifically indicated above in HPI.   Relevant past medical history reviewed and updated as indicated.   Allergies and medications reviewed and updated.   Current Outpatient Medications:    amiodarone (PACERONE) 200 MG tablet, Take 300 mg by mouth daily., Disp: , Rfl:    aspirin EC 81 MG tablet, Take 81 mg by mouth daily., Disp: , Rfl:    desonide (DESOWEN) 0.05 % cream, Apply topically 2 (two) times daily., Disp: 30 g, Rfl: 0   furosemide (LASIX) 10 MG/ML solution, Take 10 mg by mouth 2 (two) times daily., Disp: , Rfl:    loratadine (CHILDRENS LORATADINE) 5 MG/5ML syrup, Take 10 mg by mouth daily as needed for allergies. , Disp: , Rfl:    LORazepam (ATIVAN) 2 MG tablet, Take 1 tablet (2 mg total) by mouth as needed for anxiety., Disp: 20 tablet, Rfl: 2   predniSONE (DELTASONE) 20 MG tablet, Take two tablets once daily for five days, Disp: 10 tablet, Rfl: 0   propranolol (INDERAL) 60 MG tablet, Take 60 mg by mouth 3 (three) times daily.,  Disp: , Rfl:    Sildenafil Citrate 10 MG/ML SUSR, Take 10 mg by mouth 3 (three) times daily., Disp: , Rfl:    spironolactone (ALDACTONE) 25 MG tablet, Take 25 mg by mouth daily., Disp: , Rfl: 6   traZODone (DESYREL) 100 MG tablet, TAKE 1 TABLET BY MOUTH EVERYDAY AT BEDTIME, Disp: 90 tablet, Rfl: 1   Vitamin D3 (VITAMIN D) 25 MCG tablet, Take 1,000 Units by mouth daily., Disp: , Rfl:   Allergies  Allergen Reactions   Adhesive [Tape] Itching and Rash    Able to tolerate paper tape    Silicone Rash   Wound Dressing Adhesive Itching and Rash    blisters, Able to tolerate paper tape    Objective:   Temp 98 F (36.7 C) (Temporal)   Ht 5' 3.5" (1.613 m)   Wt 156 lb (70.8 kg)   BMI 27.20 kg/m    Physical Exam Vitals reviewed.  Constitutional:      General: He is not in acute distress.    Appearance: Normal appearance. He is normal weight. He is not ill-appearing, toxic-appearing or diaphoretic.  HENT:     Head: Normocephalic.     Right Ear: Tympanic membrane normal.     Left Ear: Tympanic membrane normal.     Nose: Nose normal.     Mouth/Throat:  Mouth: Mucous membranes are moist.  Eyes:     Pupils: Pupils are equal, round, and reactive to light.  Cardiovascular:     Rate and Rhythm: Normal rate and regular rhythm.  Pulmonary:     Effort: Pulmonary effort is normal.     Breath sounds: Normal breath sounds. No wheezing.  Musculoskeletal:        General: Normal range of motion.     Cervical back: Normal range of motion.  Neurological:     General: No focal deficit present.     Mental Status: He is alert and oriented to person, place, and time. Mental status is at baseline.  Psychiatric:        Mood and Affect: Mood normal.        Behavior: Behavior normal.        Thought Content: Thought content normal.        Judgment: Judgment normal.   Unable to assess ears due to pt refusing to allow me to .   Assessment & Plan:  Acute cough Assessment & Plan: Pt in good  spirits however cough continues without resolution and slightly worsening.  Low suspicion for bacterial etiology, suspect viral.  Will give prednisone 40 mg daily x 5 days (approved via message mom showed me from cardiology for short burst), and mom to start tomorrow if symptoms not improving overnight. Will hold on an antibiotic. Can continue mucinex, saline nose spray and vaporizer.   Orders: -     predniSONE; Take two tablets once daily for five days  Dispense: 10 tablet; Refill: 0     Follow up plan: Return in about 1 month (around 02/05/2023).  Mort Sawyers, FNP

## 2023-01-08 ENCOUNTER — Ambulatory Visit: Payer: Medicare Other | Admitting: Family

## 2023-02-20 ENCOUNTER — Other Ambulatory Visit: Payer: Self-pay | Admitting: Family

## 2023-02-20 DIAGNOSIS — G479 Sleep disorder, unspecified: Secondary | ICD-10-CM

## 2023-04-07 ENCOUNTER — Ambulatory Visit: Payer: Self-pay | Admitting: Family

## 2023-04-07 ENCOUNTER — Encounter: Payer: Self-pay | Admitting: Family

## 2023-04-07 ENCOUNTER — Telehealth: Payer: Medicare Other | Admitting: Physician Assistant

## 2023-04-07 DIAGNOSIS — R6889 Other general symptoms and signs: Secondary | ICD-10-CM | POA: Diagnosis not present

## 2023-04-07 DIAGNOSIS — Z20828 Contact with and (suspected) exposure to other viral communicable diseases: Secondary | ICD-10-CM | POA: Diagnosis not present

## 2023-04-07 MED ORDER — OSELTAMIVIR PHOSPHATE 75 MG PO CAPS: 75.0000 mg | ORAL_CAPSULE | Freq: Two times a day (BID) | ORAL | 0 refills | Status: AC

## 2023-04-07 NOTE — Telephone Encounter (Signed)
Pt's mother also sent a MyChart message. This has been handled and the pt has been scheduled to come in for an appointment to be tested.

## 2023-04-07 NOTE — Telephone Encounter (Signed)
Information obtained from mother, [name].  Pt is autistic and nonverbal Chief Complaint: per mother he is more whinny Symptoms: lethargic Frequency: today, exposed 4 days ago  Disposition: [] ED /[] Urgent Care (no appt availability in office) / [x] Appointment(In office/virtual)/ []   Virtual Care/ [] Home Care/ [] Refused Recommended Disposition /[]  Mobile Bus/ []  Follow-up with PCP  Additional Notes: Exposed to flu, pt is high risk, did get flu vax in Oct. Pt was scheduled for an appt tomorrow. Pt mother believes he may not be feeling well d/t "being more whinny than normal". 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 Mother is concerned about tamiflu mixing with his other medications.     Copied from CRM [id number]. Topic: Clinical - Red Word Triage >> Apr 07, 2023  3:46 PM Denese Killings wrote: Red Word that prompted transfer to Nurse Triage: Patient has been exposed to the Flu. Patient mom stated that he is very fatigue and whinny. Patient is non verbal. Reason for Disposition  [1] Influenza EXPOSURE  (Close Contact) within last 7 days AND [2] exposed person is HIGH RISK (e.g., age > 64 years, pregnant, HIV+, chronic medical condition) AND [3] NO respiratory symptoms  Answer Assessment - Initial Assessment Questions 1. TYPE of EXPOSURE: "How were you exposed?" (e.g., close contact, not a close contact)     Close contact 2. DATE of EXPOSURE: "When did the exposure occur?" (e.g., hour, days, weeks)    4 days ago they were with positive individuals 4. HIGH RISK for COMPLICATIONS: "Do you have any heart or lung problems?" "Do you have a weakened immune system?" (e.g., CHF, COPD, asthma, HIV positive, chemotherapy, renal failure, diabetes mellitus, sickle cell anemia)     Yes, heart defect 5. SYMPTOMS: "Do you have any symptoms?" (e.g., cough, fever, sore throat, difficulty breathing).      Exie Parody, he is acting like he doesn't feel good. Per mother pt is nonverbal.  Protocols used: Influenza (Flu) Exposure-A-AH

## 2023-04-07 NOTE — Patient Instructions (Signed)
 Keith [name], thank you for joining [name], [name] for today's virtual visit.  While this provider is not your primary care provider (PCP), if your PCP is located in our provider database this encounter information will be shared with them immediately following your visit.   A [facility] MyChart account gives you access to today's visit and all your visits, tests, and labs performed at [facility] " click here if you don't have a [name] MyChart account or go to [email or web address]  Consent: (Patient) Keith [name] provided verbal consent for this virtual visit at the beginning of the encounter.  Current Medications:  Current Outpatient Medications:    oseltamivir (TAMIFLU) 75 MG capsule, Take 1 capsule (75 mg total) by mouth 2 (two) times daily for 5 days., Disp: 10 capsule, Rfl: 0   amiodarone (PACERONE) 200 MG tablet, Take 300 mg by mouth daily., Disp: , Rfl:    aspirin EC 81 MG tablet, Take 81 mg by mouth daily., Disp: , Rfl:    desonide (DESOWEN) 0.05 % cream, Apply topically 2 (two) times daily., Disp: 30 g, Rfl: 0   furosemide (LASIX) 10 MG/ML solution, Take 10 mg by mouth 2 (two) times daily., Disp: , Rfl:    loratadine (CHILDRENS LORATADINE) 5 MG/5ML syrup, Take 10 mg by mouth daily as needed for allergies. , Disp: , Rfl:    LORazepam (ATIVAN) 2 MG tablet, Take 1 tablet (2 mg total) by mouth as needed for anxiety., Disp: 20 tablet, Rfl: 2   predniSONE (DELTASONE) 20 MG tablet, Take two tablets once daily for five days, Disp: 10 tablet, Rfl: 0   propranolol (INDERAL) 60 MG tablet, Take 60 mg by mouth 3 (three) times daily., Disp: , Rfl:    Sildenafil Citrate 10 MG/ML SUSR, Take 10 mg by mouth 3 (three) times daily., Disp: , Rfl:    spironolactone (ALDACTONE) 25 MG tablet, Take 25 mg by mouth daily., Disp: , Rfl: 6   traZODone (DESYREL) 100 MG tablet, TAKE 1 TABLET BY MOUTH EVERYDAY AT BEDTIME, Disp: 90 tablet, Rfl: 1   Vitamin D3 (VITAMIN D) 25 MCG  tablet, Take 1,000 Units by mouth daily., Disp: , Rfl:    Medications ordered in this encounter:  Meds ordered this encounter  Medications   oseltamivir (TAMIFLU) 75 MG capsule    Sig: Take 1 capsule (75 mg total) by mouth 2 (two) times daily for 5 days.    Dispense:  10 capsule    Refill:  0    Supervising Provider:   Merrilee Jansky [[id number]]     *If you need refills on other medications prior to your next appointment, please contact your pharmacy*  Follow-Up: Call back or seek an in-person evaluation if the symptoms worsen or if the condition fails to improve as anticipated.  [facility] [phone]  Other Instructions Influenza, Adult Influenza is also called the flu. It's an infection that affects your respiratory tract. This includes your nose, throat, windpipe, and lungs. The flu is contagious. This means it spreads easily from person to person. It causes symptoms that are like a cold. It can also cause a high fever and body aches. What are the causes? The flu is caused by the influenza virus. You can get it by: Breathing in droplets that are in the air after an infected person coughs or sneezes. Touching something that has the virus on it and then touching your mouth, nose, or eyes. What increases the risk?  You may be more likely to get the flu if: You don't wash your hands often. You're near a lot of people during cold and flu season. You touch your mouth, eyes, or nose without washing your hands first. You don't get a flu shot each year. You may also be more at risk for the flu and serious problems, such as a lung infection called pneumonia, if: You're older than 65. You're pregnant. Your immune system is weak. Your immune system is your body's defense system. You have a long-term, or chronic, condition, such as: Heart, kidney, or lung disease. Diabetes. A liver disorder. Asthma. You're very overweight. You have anemia. This is when you don't  have enough red blood cells in your body. What are the signs or symptoms? Flu symptoms often start all of a sudden. They may last 4-14 days and include: Fever and chills. Headaches, body aches, or muscle aches. Sore throat. Cough. Runny or stuffy nose. Discomfort in your chest. Not wanting to eat as much as normal. Feeling weak or tired. Feeling dizzy. Nausea or vomiting. How is this diagnosed? The flu may be diagnosed based on your symptoms and medical history. You may also have a physical exam. A swab may be taken from your nose or throat and tested for the virus. How is this treated? If the flu is found early, you can be treated with antiviral medicine. This may be given to you by mouth or through an IV. It can help you feel less sick and get better faster. Taking care of yourself at home can also help your symptoms get better. Your health care provider may tell you to: Take over-the-counter medicines. Drink lots of fluids. The flu often goes away on its own. If you have very bad symptoms or problems caused by the flu, you may need to be treated in a hospital. Follow these instructions at home: Activity Rest as needed. Get lots of sleep. Stay home from work or school as told by your provider. Leave home only to go see your provider. Do not leave home for other reasons until you don't have a fever for 24 hours without taking medicine. Eating and drinking Take an oral rehydration solution (ORS). This is a drink that is sold at pharmacies and stores. Drink enough fluid to keep your pee pale yellow. Try to drink small amounts of clear fluids. These include water, ice chips, fruit juice mixed with water, and low-calorie sports drinks. Try to eat bland foods that are easy to digest. These include bananas, applesauce, rice, lean meats, toast, and crackers. Avoid drinks that have a lot of sugar or caffeine in them. These include energy drinks, regular sports drinks, and soda. Do not drink  alcohol. Do not eat spicy or fatty foods. General instructions     Take your medicines only as told by your provider. Use a cool mist humidifier to add moisture to the air in your home. This can make it easier for you to breathe. You should also clean the humidifier every day. To do so: Empty the water. Pour clean water in. Cover your mouth and nose when you cough or sneeze. Wash your hands with soap and water often and for at least 20 seconds. It's extra important to do so after you cough or sneeze. If you can't use soap and water, use hand sanitizer. How is this prevented?  Get a flu shot every year. Ask your provider when you should get your flu shot. Stay away from people  who are sick during fall and winter. Fall and winter are cold and flu season. Contact a health care provider if: You get new symptoms. You have chest pain. You have watery poop, also called diarrhea. You have a fever. Your cough gets worse. You start to have more mucus. You feel like you may vomit, or you vomit. Get help right away if: You become short of breath or have trouble breathing. Your skin or nails turn blue. You have very bad pain or stiffness in your neck. You get a sudden headache or pain in your face or ear. You vomit each time you eat or drink. These symptoms may be an emergency. Call 911 right away. Do not wait to see if the symptoms will go away. Do not drive yourself to the hospital. This information is not intended to replace advice given to you by your health care provider. Make sure you discuss any questions you have with your health care provider. Document Revised: 11/13/2022 Document Reviewed: 03/20/2022 Elsevier Patient Education  2024 Elsevier Inc.   If you have been instructed to have an in-person evaluation today at a local Urgent Care facility, please use the link below. It will take you to a list of all of our available [facility], including address, phone number  and hours of operation. Please do not delay care.  [facility]  If you or a family member do not have a primary care provider, use the link below to schedule a visit and establish care. When you choose a [facility] primary care physician or advanced practice provider, you gain a long-term partner in health. Find a Primary Care Provider  Learn more about [facility]'s in-office and virtual care options: [facility] - Get Care Now

## 2023-04-07 NOTE — Progress Notes (Signed)
Virtual Visit Consent   Keith [name], you are scheduled for a virtual visit with a [facility] provider today. Just as with appointments in the office, your consent must be obtained to participate. Your consent will be active for this visit and any virtual visit you may have with one of our providers in the next 365 days. If you have a MyChart account, a copy of this consent can be sent to you electronically.  As this is a virtual visit, video technology does not allow for your provider to perform a traditional examination. This may limit your provider's ability to fully assess your condition. If your provider identifies any concerns that need to be evaluated in person or the need to arrange testing (such as labs, EKG, etc.), we will make arrangements to do so. Although advances in technology are sophisticated, we cannot ensure that it will always work on either your end or our end. If the connection with a video visit is poor, the visit may have to be switched to a telephone visit. With either a video or telephone visit, we are not always able to ensure that we have a secure connection.  By engaging in this virtual visit, you consent to the provision of healthcare and authorize for your insurance to be billed (if applicable) for the services provided during this visit. Depending on your insurance coverage, you may receive a charge related to this service. Kingsley Spittle  mother / guardian gave permission I need to obtain your verbal consent now. Are you willing to proceed with your visit today? Keith [name] has provided verbal consent on 04/07/2023 for a virtual visit (video or telephone). Keith Jaffe, [name]  Date: 04/07/2023 7:04 PM   Virtual Visit via Video Note   I, Keith [name], connected with  Keith [name]  ([id number], Jul 04, 2000) on 04/07/23 at  7:15 PM EST by a video-enabled telemedicine application and verified that I am speaking with the correct person using two  identifiers.  Location: Patient: Virtual Visit Location Patient: Home Provider: Virtual Visit Location Provider: Home Office   I discussed the limitations of evaluation and management by telemedicine and the availability of in person appointments. The patient expressed understanding and agreed to proceed.    History of Present Illness: Keith [name] is a 23 y.o. who identifies as a male who was assigned male at birth, special needs non-verbal patient with a history of heart condition, presented with symptoms of the flu. The patient's mother reported that he started sounding stuffy the previous night, which she initially attributed to allergies and treated with Zyrtec. However, his daycare reported that he seemed tired and whiny during the day, and his mother noticed the same behavior when she picked him up. The patient is nonverbal, making it difficult to ascertain specific symptoms such as headache or sore throat. However, his mother reported that he seems tired and unwell. The patient has received his annual flu vaccine due to his heart condition. The family recently returned from a trip to [place], where they believe he contracted the flu despite taking precautions. The patient's family members have tested positive for flu. Mother provides all history.    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) 10/01/2021    Sensitivity to sunlight 10/01/2021   Intellectual disability 10/01/2021   Lymphopenia 10/01/2021   Thrombocytopenia (HCC) 10/01/2021   History of recurrent UTIs 01/12/2020   H/O seasonal allergies 05/04/2019   History of gastrointestinal ulcer 12/19/2018   Constipation 12/18/2018   Status post ablation of atrial flutter 07/11/2015   Ventricular tachycardia (HCC) 06/21/2015   Ventricular fibrillation (HCC) 06/21/2015   Status post catheter ablation of slow pathway 06/07/2015   Dental disease 08/10/2014   Impulse control disorder 02/14/2013   SVT (supraventricular tachycardia) (HCC) 07/05/2012   Complete A-V canal, 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   Mitral regurgitation 12/10/2011    Allergies:  Allergies  Allergen Reactions   Adhesive [Tape] Itching and Rash    Able to tolerate paper tape    Silicone Rash   Wound Dressing Adhesive Itching and Rash    blisters, Able to tolerate paper tape   Medications:  Current Outpatient Medications:    oseltamivir (TAMIFLU) 75 MG capsule, Take 1 capsule (75 mg total) by mouth 2 (two) times daily for 5 days., Disp: 10 capsule, Rfl: 0   amiodarone (PACERONE) 200 MG tablet, Take 300 mg by mouth daily., Disp: , Rfl:    aspirin EC 81 MG tablet, Take 81 mg by mouth daily., Disp: , Rfl:    desonide (DESOWEN) 0.05 % cream, Apply topically 2 (two) times daily., Disp: 30 g, Rfl: 0   furosemide (LASIX) 10 MG/ML solution, Take 10 mg by mouth 2 (two) times daily., Disp: , Rfl:    loratadine (CHILDRENS LORATADINE) 5 MG/5ML syrup, Take 10 mg by mouth daily as needed for allergies. , Disp: , Rfl:    LORazepam (ATIVAN) 2 MG tablet, Take 1 tablet (2 mg total) by mouth as needed for anxiety., Disp: 20 tablet, Rfl: 2   predniSONE (DELTASONE) 20 MG tablet, Take two tablets once daily for five days, Disp: 10 tablet, Rfl: 0   propranolol (INDERAL) 60 MG tablet, Take 60 mg by mouth 3 (three) times daily., Disp: , Rfl:    Sildenafil Citrate 10 MG/ML SUSR, Take 10 mg by  mouth 3 (three) times daily., Disp: , Rfl:    spironolactone (ALDACTONE) 25 MG tablet, Take 25 mg by mouth daily., Disp: , Rfl: 6   traZODone (DESYREL) 100 MG tablet, TAKE 1 TABLET BY MOUTH EVERYDAY AT BEDTIME, Disp: 90 tablet, Rfl: 1   Vitamin D3 (VITAMIN D) 25 MCG tablet, Take 1,000 Units by mouth daily., Disp: , Rfl:   Observations/Objective: Patient is well-developed, well-nourished in no acute distress.  Resting comfortably  at home.  Head is normocephalic, atraumatic.  No labored breathing.  Patient is alert and oriented at baseline.    Assessment and Plan: 1. Flu-like symptoms (Primary) - oseltamivir (TAMIFLU) 75 MG capsule; Take 1 capsule (75 mg total) by mouth 2 (two) times daily for 5 days.  Dispense: 10 capsule; Refill: 0  2. Exposure to the flu - oseltamivir (TAMIFLU) 75 MG capsule; Take 1 capsule (75 mg total) by mouth 2 (two) times daily for 5 days.  Dispense: 10 capsule; Refill: 0   Recent exposure to confirmed cases of influenza A. Non-specific symptoms of stuffiness and fatigue. Vaccinated against influenza. High risk due to  underlying heart condition. -Start Tamiflu 75mg  BID for 5 days. .  Follow Up Instructions: I discussed the assessment and treatment plan with the patient. The patient was provided an opportunity to ask questions and all were answered. The patient agreed with the plan and demonstrated an understanding of the instructions.  A copy of instructions were sent to the patient via MyChart unless otherwise noted below.     The patient was advised to call back or seek an in-person evaluation if the symptoms worsen or if the condition fails to improve as anticipated.    Kasandra Knudsen Mayers, [name]

## 2023-04-08 ENCOUNTER — Ambulatory Visit (INDEPENDENT_AMBULATORY_CARE_PROVIDER_SITE_OTHER): Payer: Medicare Other | Admitting: General Practice

## 2023-04-08 ENCOUNTER — Encounter: Payer: Self-pay | Admitting: General Practice

## 2023-04-08 VITALS — BP 102/80 | HR 63 | Temp 97.6°F | Ht 63.5 in | Wt 157.0 lb

## 2023-04-08 DIAGNOSIS — R051 Acute cough: Secondary | ICD-10-CM

## 2023-04-08 LAB — POCT INFLUENZA A/B
Influenza A, POC: NEGATIVE
Influenza B, POC: NEGATIVE

## 2023-04-08 LAB — POC COVID19 BINAXNOW: SARS Coronavirus 2 Ag: NEGATIVE

## 2023-04-08 MED ORDER — BENZONATATE 200 MG PO CAPS
200.0000 mg | ORAL_CAPSULE | Freq: Three times a day (TID) | ORAL | 0 refills | Status: DC | PRN
Start: 2023-04-08 — End: 2023-09-04

## 2023-04-08 NOTE — Assessment & Plan Note (Signed)
Poc covid and flu negative.   He has had two doses of Tamiflu.   Start Benzonatate capsules for cough. Take 1 capsule by mouth three times daily as needed for cough. Recommendations given for OTC medication. Recommend rest, hydration.   Follow up with PCP if symptoms worsen or do not improve.  ER/UC precautions given.

## 2023-04-08 NOTE — Progress Notes (Signed)
Established Patient Office Visit  Subjective   Patient ID: Keith [name], male    DOB: 06-04-2000  Age: 23 y.o. MRN: [id number]  Chief Complaint  Patient presents with   Cough    And congestion since Monday. Patient has been exposed to the flu by other family members. Started tamiflu last night from virtual visit yesterday.     Cough Pertinent negatives include no chest pain, chills, fever, headaches, heartburn or shortness of breath.    Keith [name] is a 23 year old male with special needs, non-verbal, patient of Mort Sawyers, FNP, with a significant cardiac history that includes a heart defect, pulmonary hypertension, presents today for an acute visit.  His mother is present to provide HPI.   His mother called the office yesterday and stated that the patient had been [place] and multiple family members tested positive for Influenza A. His symptom onsent was Monday night with congestion and runny nose. Yesterday patient was more fatigue and whinny. He does have a dry cough. He felt warm yesterday but fever was not checked. He did take the flu vaccine this season.   He had a video visit yesterday with Maurene Capes PA who sent in the prescription for Tamiflu yesterday. Today she reports that he seems slightly back to baseline and is not as whiny anymore. His appetite is good. She would like for him to be tested for flu.    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) 10/01/2021   Sensitivity to sunlight 10/01/2021   Intellectual disability 10/01/2021   Lymphopenia 10/01/2021   Thrombocytopenia (HCC) 10/01/2021   History of recurrent UTIs 01/12/2020   H/O seasonal allergies 05/04/2019   History of gastrointestinal ulcer 12/19/2018   Constipation 12/18/2018   Status post ablation of atrial flutter 07/11/2015   Ventricular tachycardia (HCC) 06/21/2015   Ventricular fibrillation (HCC) 06/21/2015   Status post catheter ablation of slow pathway 06/07/2015   Dental disease 08/10/2014   Impulse control disorder 02/14/2013   SVT (supraventricular tachycardia) (HCC) 07/05/2012   Complete A-V canal, 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   Mitral regurgitation 12/10/2011   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   SVT (supraventricular tachycardia) (HCC)    previously 3x before, total of 4 including today 05/08/15   Ventricular fibrillation (HCC)    inducible during EP study Dr [facility], [facility] 06/21/2015   VSD (ventricular septal defect)    Allergies  Allergen Reactions   Adhesive [Tape] Itching and Rash    Able to tolerate paper tape    Silicone Rash   Wound Dressing Adhesive Itching and Rash    blisters, Able to tolerate paper tape  04/08/2023    8:22 AM 10/16/2022    4:02 PM 07/10/2022    7:39 AM  Depression screen PHQ 2/9  Decreased Interest 0 0 0  Down, Depressed, Hopeless 0 0 0  PHQ - 2 Score 0 0 0  Altered sleeping 0    Tired, decreased energy 0    Change in appetite 0    Feeling bad or failure about yourself  0    Trouble concentrating 0    Moving slowly or fidgety/restless 0    Suicidal thoughts 0    PHQ-9 Score 0    Difficult doing work/chores Not difficult at all          04/08/2023    8:23 AM 07/10/2022    7:39 AM  GAD 7 : Generalized Anxiety Score  Nervous, Anxious, on Edge 0 0  Control/stop worrying 0 0  Worry too much - different things 0 0  Trouble relaxing 0 0  Restless 0 0  Easily annoyed or irritable 0 0  Afraid - awful might happen 0 0  Total GAD 7 Score 0 0  Anxiety Difficulty Not difficult at all Not difficult at all      Review of Systems  Constitutional:  Negative for chills and fever.  HENT:  Positive for congestion.   Respiratory:  Positive for cough. Negative for shortness of breath.   Cardiovascular:  Negative for chest pain.  Gastrointestinal:  Negative for abdominal pain, constipation, diarrhea, heartburn, nausea and vomiting.  Genitourinary:  Negative for dysuria, frequency and urgency.  Neurological:  Negative for dizziness and headaches.  Endo/Heme/Allergies:  Negative for polydipsia.  Psychiatric/Behavioral:  Negative for depression and suicidal ideas. The patient is not nervous/anxious.       Objective:     BP 102/80 (BP Location: Left Arm, Patient Position: Sitting, Cuff Size: Normal)   Pulse 63   Temp 97.6 F (36.4 C) (Temporal)   Ht 5' 3.5" (1.613 m)   Wt 157 lb (71.2 kg)   SpO2 93%   BMI 27.38 kg/m  BP Readings from Last 3 Encounters:  04/08/23 102/80  12/23/22 112/72  11/14/22 120/72   Wt Readings from Last 3 Encounters:  04/08/23 157 lb (71.2 kg)  01/06/23 156 lb (70.8 kg)  12/23/22 156 lb 3.2 oz (70.9 kg)      Physical Exam Vitals and nursing note reviewed.  Constitutional:      Appearance: Normal appearance.  HENT:     Right Ear: Tympanic membrane, ear canal and external ear normal.     Left Ear: Tympanic membrane, ear canal and external ear normal.     Mouth/Throat:     Mouth: Mucous membranes are moist.  Eyes:     Conjunctiva/sclera: Conjunctivae normal.  Cardiovascular:     Rate and Rhythm: Normal rate and regular rhythm.     Pulses: Normal pulses.     Heart sounds: Normal heart  sounds.  Pulmonary:     Effort: Pulmonary effort is normal.     Breath sounds: Normal breath sounds.  Skin:    General: Skin is warm.  Neurological:     Mental Status: He is alert and oriented to person, place, and time.  Psychiatric:        Mood and Affect: Mood normal.        Behavior: Behavior normal.        Thought Content: Thought content normal.        Judgment: Judgment normal.      Results for  orders placed or performed in visit on 04/08/23  POCT Influenza A/B  Result Value Ref Range   Influenza A, POC Negative Negative   Influenza B, POC Negative Negative  POC COVID-19  Result Value Ref Range   SARS Coronavirus 2 Ag Negative Negative       The ASCVD Risk score (Arnett DK, et al., 2019) failed to calculate for the following reasons:   The 2019 ASCVD risk score is only valid for ages 6 to 66    Assessment & Plan:  Acute cough Assessment & Plan: Poc covid and flu negative.   He has had two doses of Tamiflu.   Start Benzonatate capsules for cough. Take 1 capsule by mouth three times daily as needed for cough. Recommendations given for OTC medication. Recommend rest, hydration.   Follow up with PCP if symptoms worsen or do not improve.  ER/UC precautions given.   Orders: -     POCT Influenza A/B -     POC COVID-19 BinaxNow -     Benzonatate; Take 1 capsule (200 mg total) by mouth 3 (three) times daily as needed.  Dispense: 20 capsule; Refill: 0     Return if symptoms worsen or fail to improve.    [place] Charon, NP

## 2023-04-08 NOTE — Patient Instructions (Signed)
Flu test negative.  Covid test negative.   You can try a few things over the counter to help with your symptoms including:  Cough: Delsym or Robitussin (get the off brand, works just as well) Chest Congestion: Mucinex (plain) Nasal Congestion/Ear Pressure/Sinus Pressure: Try using Flonase (fluticasone) nasal spray. Instill 1 spray in each nostril twice daily. This can be purchased over the counter. Body aches, fevers, headache: Ibuprofen (not to exceed 2400 mg in 24 hours) or Acetaminophen-Tylenol (not to exceed 3000 mg in 24 hours) Runny Nose/Throat Drainage/Sneezing/Itchy or Watery Eyes: An antihistamine such as Zyrtec, Claritin, Xyzal, Allegra  Start Benzonatate capsules for cough. Take 1 capsule by mouth three times daily as needed for cough.  Please schedule appointment with PCP if symptoms worsen or fail to improve.  It was a pleasure meeting you!

## 2023-06-18 ENCOUNTER — Ambulatory Visit (INDEPENDENT_AMBULATORY_CARE_PROVIDER_SITE_OTHER): Admitting: Primary Care

## 2023-06-18 ENCOUNTER — Ambulatory Visit
Admission: RE | Admit: 2023-06-18 | Discharge: 2023-06-18 | Disposition: A | Discharge status: Home / Self Care | Source: Ambulatory Visit | Attending: Primary Care

## 2023-06-18 ENCOUNTER — Encounter: Payer: Self-pay | Admitting: Primary Care

## 2023-06-18 VITALS — BP 120/64 | Temp 97.8°F | Ht 63.5 in | Wt 150.2 lb

## 2023-06-18 DIAGNOSIS — R2242 Localized swelling, mass and lump, left lower limb: Secondary | ICD-10-CM | POA: Diagnosis not present

## 2023-06-18 DIAGNOSIS — R0981 Nasal congestion: Secondary | ICD-10-CM | POA: Insufficient documentation

## 2023-06-18 DIAGNOSIS — R229 Localized swelling, mass and lump, unspecified: Secondary | ICD-10-CM | POA: Insufficient documentation

## 2023-06-18 DIAGNOSIS — Z0001 Encounter for general adult medical examination with abnormal findings: Secondary | ICD-10-CM | POA: Diagnosis not present

## 2023-06-18 NOTE — Assessment & Plan Note (Signed)
 Immunizations UTD.  Exam stable.  Follow up in 1 year for repeat physical. Forms completed today.

## 2023-06-18 NOTE — Patient Instructions (Signed)
Complete xray(s) prior to leaving today. I will notify you of your results once received.  It was a pleasure meeting you!  

## 2023-06-18 NOTE — Assessment & Plan Note (Signed)
 Allergy induced based on HPI and exam today. No evidence of acute illness.  Start Flonase, 1 spray in each nostril BID. Continue Zyrtec 10 mg daily.  Return precautions provided.

## 2023-06-18 NOTE — Assessment & Plan Note (Signed)
 Unclear etiology, could be benign cyst. Will obtain x-rays today to rule out bony involvement.  He will also follow-up with dermatology as scheduled.
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
## 2023-07-01 ENCOUNTER — Other Ambulatory Visit: Payer: Self-pay | Admitting: Primary Care

## 2023-07-01 DIAGNOSIS — R229 Localized swelling, mass and lump, unspecified: Secondary | ICD-10-CM

## 2023-07-01 NOTE — Assessment & Plan Note (Signed)
 Update from patient's mom 07/01/23:  Evaluated by dermatology who recommends watchful waiting.  If no improvement and recommends podiatry versus orthopedics.  FYI to PCP.

## 2023-07-09 ENCOUNTER — Ambulatory Visit: Payer: Self-pay

## 2023-07-09 NOTE — Telephone Encounter (Signed)
 Can we get patient set up with a virtual visit in office please ASAP

## 2023-07-09 NOTE — Telephone Encounter (Signed)
 Pt is scheduled for 07/10/23 @12 .

## 2023-07-09 NOTE — Telephone Encounter (Signed)
 Copied from CRM [id number]. Topic: Clinical - Red Word Triage >> Jul 09, 2023  8:10 AM Howard Macho wrote: Reason for CRM: patient mom called stating she tested positive for covid and now her son tested positive for covid this morning. Patient mom stated the patient is high risk because he has a heart defect. Patient mom want to know if he should come in or do the doctor recommend the antibody treatment  Chief Complaint: covid positive  Symptoms: fatigue, dry cough, symptoms started today.  Frequency: constant Pertinent Negatives: Patient denies fever, difficulty breathing Disposition: [] ED /[] Urgent Care (no appt availability in office) / [] Appointment(In office/virtual)/ []  [place] [facility]/ [x] Home Care/ [] Refused Recommended Disposition /[] [facility]/ []  Follow-up with PCP Additional Notes: Mom would treatment since symptoms just started.  States he is special needs and most likely will not wear mask to office.  States she will bring him in if necessary.  Office to f/u with mom.  Care advice given, denies questions; instructed to go to ER if becomes worse.   Reason for Disposition  [1] HIGH RISK patient (e.g., weak immune system, age > 64 years, obesity with BMI 30 or higher, pregnant, chronic lung disease or other chronic medical condition) AND [2] COVID symptoms (e.g., cough, fever)  (Exceptions: Already seen by PCP and no new or worsening symptoms.)  Answer Assessment - Initial Assessment Questions 1. COVID-19 DIAGNOSIS: "How do you know that you have COVID?" (e.g., positive lab test or self-test, diagnosed by doctor or NP/PA, symptoms after exposure).     tired 2. COVID-19 EXPOSURE: "Was there any known exposure to COVID before the symptoms began?" CDC Definition of close contact: within 6 feet (2 meters) for a total of 15 minutes or more over a 24-hour period.      Mom has it and feels she gave it to him 3. ONSET: "When did the COVID-19 symptoms start?"      This am 4.  WORST SYMPTOM: "What is your worst symptom?" (e.g., cough, fever, shortness of breath, muscle aches)     Patient is non verbal; tired, congested 5. COUGH: "Do you have a cough?" If Yes, ask: "How bad is the cough?"       Dry  6. FEVER: "Do you have a fever?" If Yes, ask: "What is your temperature, how was it measured, and when did it start?"     denies 7. RESPIRATORY STATUS: "Describe your breathing?" (e.g., normal; shortness of breath, wheezing, unable to speak)      normal 8. BETTER-SAME-WORSE: "Are you getting better, staying the same or getting worse compared to yesterday?"  If getting worse, ask, "In what way?"     na 9. OTHER SYMPTOMS: "Do you have any other symptoms?"  (e.g., chills, fatigue, headache, loss of smell or taste, muscle pain, sore throat)     Fatigue, he's non verbal 10. HIGH RISK DISEASE: "Do you have any chronic medical problems?" (e.g., asthma, heart or lung disease, weak immune system, obesity, etc.)       High risk, special needs, paced 11. VACCINE: "Have you had the COVID-19 vaccine?" If Yes, ask: "Which one, how many shots, when did you get it?"       na 12. PREGNANCY: "Is there any chance you are pregnant?" "When was your last menstrual period?"       na 13. O2 SATURATION MONITOR:  "Do you use an oxygen saturation monitor (pulse oximeter) at home?" If Yes, ask "What is your reading (oxygen  level) today?" "What is your usual oxygen saturation reading?" (e.g., 95%)       na  Protocols used: Coronavirus (COVID-19) Diagnosed or Suspected-A-AH

## 2023-07-09 NOTE — Telephone Encounter (Signed)
 Called  pt and schedule a appt

## 2023-07-10 ENCOUNTER — Telehealth: Admitting: Nurse Practitioner

## 2023-08-02 ENCOUNTER — Encounter: Payer: Self-pay | Admitting: Family

## 2023-08-02 ENCOUNTER — Other Ambulatory Visit: Payer: Self-pay | Admitting: Family

## 2023-08-02 DIAGNOSIS — G479 Sleep disorder, unspecified: Secondary | ICD-10-CM

## 2023-08-03 MED ORDER — TRAZODONE HCL 100 MG PO TABS: 100.0000 mg | ORAL_TABLET | Freq: Every day | ORAL | 3 refills | Status: AC

## 2023-08-16 ENCOUNTER — Telehealth: Admitting: Family Medicine

## 2023-08-16 DIAGNOSIS — S8012XA Contusion of left lower leg, initial encounter: Secondary | ICD-10-CM | POA: Diagnosis not present
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
## 2023-08-16 NOTE — Patient Instructions (Signed)
Hematoma A hematoma is a collection of blood under the skin, in an organ, in a body space, in a joint space, or in other tissue. The blood can thicken (clot) to form a lump that you can see and feel. The lump is often firm and may become sore and tender. Most hematomas get better in a few days to weeks. However, some hematomas may be serious and require medical care. Hematomas can range from very small to very large. What are the causes? This condition is caused by: A blunt or penetrating injury. Leakage from a blood vessel under the skin. Some medical procedures, including surgeries, such as oral surgery, face lifts, and surgeries on the joints. Some medical conditions that cause bleeding or bruising. There may be multiple hematomas that appear in different areas of the body. What increases the risk? You are more likely to develop this condition if: You are an older adult. You use blood thinners. You regularly use NSAIDs, such as ibuprofen, for pain. You play contact sports. What are the signs or symptoms?  Symptoms of this condition depend on where the hematoma is located.  Common symptoms of a hematoma that is under the skin include: A firm lump on the body. Pain and tenderness in the area. Bruising. Blue, dark blue, purple-red, or yellowish skin (discoloration) may appear at the site of the hematoma if the hematoma is close to the surface of the skin. Common symptoms of a hematoma that is deep in the tissues or body spaces may be less obvious. They include: A collection of blood in the stomach (intra-abdominal hematoma). This may cause pain in the abdomen, weakness, fainting, and shortness of breath. A collection of blood in the head (intracranial hematoma). This may cause a headache or symptoms such as weakness, trouble speaking or understanding, or a change in consciousness. How is this diagnosed? This condition is diagnosed based on: Your medical history. A physical exam. Imaging  tests, such as an ultrasound or CT scan. These may be needed if your health care provider suspects a hematoma in deeper tissues or body spaces. Blood tests. These may be needed if your health care provider believes that the hematoma is caused by a medical condition. How is this treated? Treatment for this condition depends on the cause, size, and location of the hematoma. Treatment may include: Doing nothing. The majority of hematomas do not need treatment as many of them go away on their own. Surgery or close monitoring. This may be needed for large hematomas or hematomas that affect vital organs. Medicines. Medicines may be given if there is an underlying medical cause for the hematoma. Follow these instructions at home: Managing pain, stiffness, and swelling  If directed, put ice on the injured area. To do this: Put ice in a plastic bag. Place a towel between your skin and the bag. Leave the ice on for 20 minutes, 2-3 times a day for the first couple of days. If your skin turns bright red, remove the ice right away to prevent skin damage. The risk of skin damage is higher if you cannot feel pain, heat, or cold. If directed, apply heat to the affected area as often as told by your health care provider. Use the heat source that your health care provider recommends, such as a moist heat pack or a heating pad. Place a towel between your skin and the heat source. Leave the heat on for 20-30 minutes. If your skin turns bright red, remove the heat right  away to prevent burns. The risk of burns is higher if you cannot feel pain, heat, or cold. Raise (elevate) the injured area above the level of your heart while you are sitting or lying down. If directed, wrap the affected area with an elastic bandage. The bandage applies pressure (compression) to the area, which may help to reduce swelling and promote healing. Do not wrap the bandage too tightly around the affected area. If your hematoma is on a leg  or foot (lower extremity) and is painful, your health care provider may recommend crutches. Use them as told by your health care provider. General instructions Take over-the-counter and prescription medicines only as told by your health care provider. Rest the injured area as directed by your health care provider. Keep all follow-up visits. Your health care provider may want to see how your hematoma is progressing with treatment. Contact a health care provider if: You have a fever. The swelling or discoloration gets worse. You develop more hematomas. Your pain is worse or your pain is not controlled with medicine. Your skin over the hematoma breaks or starts bleeding. Get help right away if: Your hematoma is in your chest or abdomen and you have weakness, shortness of breath, or a change in consciousness. You have a hematoma on your scalp that is caused by a fall or injury, and you also have: A headache that gets worse. Trouble speaking or understanding speech. Weakness. A change in alertness or consciousness. These symptoms may be an emergency. Get help right away. Call 911. Do not wait to see if the symptoms will go away. Do not drive yourself to the hospital. This information is not intended to replace advice given to you by your health care provider. Make sure you discuss any questions you have with your health care provider. Document Revised: 08/05/2021 Document Reviewed: 08/05/2021 Elsevier Patient Education  2024 [employer].

## 2023-09-03 ENCOUNTER — Encounter: Payer: Self-pay | Admitting: Family

## 2023-09-04 ENCOUNTER — Ambulatory Visit (INDEPENDENT_AMBULATORY_CARE_PROVIDER_SITE_OTHER): Admitting: Family Medicine

## 2023-09-04 ENCOUNTER — Encounter: Payer: Self-pay | Admitting: Family Medicine

## 2023-09-04 VITALS — BP 90/60 | HR 62 | Temp 98.1°F | Ht 63.5 in | Wt 149.2 lb

## 2023-09-04 DIAGNOSIS — S81802D Unspecified open wound, left lower leg, subsequent encounter: Secondary | ICD-10-CM

## 2023-09-04 DIAGNOSIS — S81802A Unspecified open wound, left lower leg, initial encounter: Secondary | ICD-10-CM | POA: Insufficient documentation

## 2023-09-04 MED ORDER — DOXYCYCLINE HYCLATE 100 MG PO TABS: 100.0000 mg | ORAL_TABLET | Freq: Two times a day (BID) | ORAL | 0 refills | Status: DC

## 2023-09-04 NOTE — Telephone Encounter (Signed)
 Called and spoke to mom - appointment scheduled.

## 2023-09-04 NOTE — Telephone Encounter (Signed)
 Can someone please call and get him scheduled somewhere today if anyone available and or urgent care.

## 2023-09-04 NOTE — Progress Notes (Signed)
 Patient ID: Keith [name], male    DOB: [birth year], 23 y.o.   MRN: [id number]  This visit was conducted in person.  BP 90/60   Pulse 62   Temp 98.1 F (36.7 C) (Temporal)   Ht 5' 3.5 (1.613 m)   Wt 149 lb 4 oz (67.7 kg)   SpO2 90%   BMI 26.02 kg/m    CC:  Chief Complaint  Patient presents with   Leg Injury    Follow up LL Leg Contusion    Subjective:   HPI: Keith [name] is a 23 y.o. male patient of Ginger Patrick, NP with history of  nonverbal autism, congenital heart disease, thrombocytopenia, hear failure, SVT presenting on 09/04/2023 for Leg Injury (Follow up LL Leg Contusion) Mother at OV today. Seen in ED on 08/16/2023 following injury to left lower leg resulting in a contusion.   Reviewed note.  3 days prior to ED visit getting in and out of the car noted an injury, no known fall or injury associated. Lab work was unremarkable. X-ray tib-fib showed soft tissue nodule within the anterior lateral leg without subcutaneous gas or underlying osseous abnormality ED physician did not note any associated cellulitis. Felt likely hematoma and suggested supportive care, warm compresses and Tylenol for pain.   Area has decreased in size, was flatter and looked like a scab. Yesterday at adult day care scab fell of.  He is walking normally.  No fever. He is able to walk.  No odor or discharge.   History of  MRSA infection.  Relevant past medical, surgical, family and social history reviewed and updated as indicated. Interim medical history since our last visit reviewed. Allergies and medications reviewed and updated. Outpatient Medications Prior to Visit  Medication Sig Dispense Refill   amiodarone (PACERONE) 200 MG tablet Take 300 mg by mouth daily.     aspirin EC 81 MG tablet Take 81 mg by mouth daily.     desonide  (DESOWEN ) 0.05 % cream Apply topically 2 (two) times daily. 30 g 0   furosemide (LASIX) 10 MG/ML solution Take 10 mg by mouth 2 (two) times daily.      LORazepam  (ATIVAN ) 2 MG tablet Take 1 tablet (2 mg total) by mouth as needed for anxiety. 20 tablet 2   propranolol (INDERAL) 60 MG tablet Take 60 mg by mouth 3 (three) times daily.     Sildenafil Citrate 10 MG/ML SUSR Take 10 mg by mouth 3 (three) times daily.     spironolactone (ALDACTONE) 25 MG tablet Take 25 mg by mouth daily.  6   traZODone  (DESYREL ) 100 MG tablet Take 1 tablet (100 mg total) by mouth at bedtime. 90 tablet 3   Vitamin D3 (VITAMIN D) 25 MCG tablet Take 1,000 Units by mouth daily.     benzonatate  (TESSALON ) 200 MG capsule Take 1 capsule (200 mg total) by mouth 3 (three) times daily as needed. 20 capsule 0   predniSONE  (DELTASONE ) 20 MG tablet Take two tablets once daily for five days 10 tablet 0   No facility-administered medications prior to visit.     Per HPI unless specifically indicated in ROS section below Review of Systems  Constitutional:  Negative for fatigue and fever.  HENT:  Negative for ear pain.   Eyes:  Negative for pain.  Respiratory:  Negative for cough and shortness of breath.   Cardiovascular:  Negative for chest pain, palpitations and leg swelling.  Gastrointestinal:  Negative for abdominal pain.  Genitourinary:  Negative for dysuria.  Musculoskeletal:  Negative for arthralgias.  Neurological:  Negative for syncope, light-headedness and headaches.  Psychiatric/Behavioral:  Negative for dysphoric mood.    Objective:  BP 90/60   Pulse 62   Temp 98.1 F (36.7 C) (Temporal)   Ht 5' 3.5 (1.613 m)   Wt 149 lb 4 oz (67.7 kg)   SpO2 90%   BMI 26.02 kg/m   Wt Readings from Last 3 Encounters:  09/04/23 149 lb 4 oz (67.7 kg)  06/18/23 150 lb 4 oz (68.2 kg)  04/08/23 157 lb (71.2 kg)      Physical Exam Vitals reviewed.  Constitutional:      Appearance: He is well-developed.  HENT:     Head: Normocephalic.     Right Ear: Hearing normal.     Left Ear: Hearing normal.  Neck:     Thyroid: No thyroid mass or thyromegaly.     Vascular: No  carotid bruit.     Trachea: Trachea normal.  Cardiovascular:     Rate and Rhythm: Normal rate and regular rhythm.     Pulses: Normal pulses.     Heart sounds: Heart sounds not distant. No murmur heard.    No friction rub. No gallop.     Comments: No peripheral edema Pulmonary:     Effort: Pulmonary effort is normal. No respiratory distress.     Breath sounds: Normal breath sounds.  Skin:    General: Skin is warm and dry.     Findings: Lesion present. No rash.     Comments: Chronic venous stasis changes in lower legs  Neurological:     Mental Status: Mental status is at baseline.  Psychiatric:        Speech: Speech normal.       Results for orders placed or performed in visit on 04/08/23  POCT Influenza A/B   Collection Time: 04/08/23  8:30 AM  Result Value Ref Range   Influenza A, POC Negative Negative   Influenza B, POC Negative Negative  POC COVID-19   Collection Time: 04/08/23  8:30 AM  Result Value Ref Range   SARS Coronavirus 2 Ag Negative Negative    Assessment and Plan  Wound of left lower extremity, subsequent encounter Assessment & Plan: Acute, no clear ongoing infection but patient high risk for infection with history of MRSA and autism. Will treat with doxycycline  100 mg twice daily for 10 days. Encouraged mother to wash wound daily with antibacterial soap followed with antibiotic ointment and dressing change. Recommend petroleum dressing with absorbable dressing held in place with Coban.  Recommend elevation of legs as patient tolerates. Will go ahead and refer to wound care but will have patient follow-up with PCP in 1 week.  Reviewed ER and return precautions in detail with mother.  Orders: -     AMB referral to wound care center  Other orders -     Doxycycline  Hyclate; Take 1 tablet (100 mg total) by mouth 2 (two) times daily. Soak, crush and mild with applesauce  Dispense: 20 tablet; Refill: 0    No follow-ups on file.   Greig Ring, MD

## 2023-09-04 NOTE — Assessment & Plan Note (Signed)
 Acute, no clear ongoing infection but patient high risk for infection with history of MRSA and autism. Will treat with doxycycline  100 mg twice daily for 10 days. Encouraged mother to wash wound daily with antibacterial soap followed with antibiotic ointment and dressing change. Recommend petroleum dressing with absorbable dressing held in place with Coban.  Recommend elevation of legs as patient tolerates. Will go ahead and refer to wound care but will have patient follow-up with PCP in 1 week.  Reviewed ER and return precautions in detail with mother.

## 2023-09-07 ENCOUNTER — Inpatient Hospital Stay: Admitting: Family

## 2023-09-10 ENCOUNTER — Ambulatory Visit: Admitting: Family

## 2023-09-10 ENCOUNTER — Encounter: Payer: Self-pay | Admitting: Family

## 2023-09-10 ENCOUNTER — Encounter (HOSPITAL_BASED_OUTPATIENT_CLINIC_OR_DEPARTMENT_OTHER): Attending: Internal Medicine | Admitting: Internal Medicine

## 2023-09-10 VITALS — BP 92/60 | HR 60 | Temp 98.2°F | Ht 63.5 in | Wt 149.0 lb

## 2023-09-10 DIAGNOSIS — F840 Autistic disorder: Secondary | ICD-10-CM | POA: Diagnosis not present

## 2023-09-10 DIAGNOSIS — L97822 Non-pressure chronic ulcer of other part of left lower leg with fat layer exposed: Secondary | ICD-10-CM | POA: Diagnosis not present

## 2023-09-10 DIAGNOSIS — I872 Venous insufficiency (chronic) (peripheral): Secondary | ICD-10-CM | POA: Diagnosis not present

## 2023-09-10 DIAGNOSIS — I87311 Chronic venous hypertension (idiopathic) with ulcer of right lower extremity: Secondary | ICD-10-CM | POA: Insufficient documentation

## 2023-09-10 DIAGNOSIS — T798XXA Other early complications of trauma, initial encounter: Secondary | ICD-10-CM | POA: Insufficient documentation

## 2023-09-10 DIAGNOSIS — X58XXXA Exposure to other specified factors, initial encounter: Secondary | ICD-10-CM | POA: Diagnosis not present

## 2023-09-10 DIAGNOSIS — S81802A Unspecified open wound, left lower leg, initial encounter: Secondary | ICD-10-CM | POA: Diagnosis not present

## 2023-09-10 NOTE — Progress Notes (Unsigned)
 Established Patient Office Visit  Subjective:   Patient ID: Keith [name], male    DOB: 2000/07/20  Age: 23 y.o. MRN: [id number]  CC:  Chief Complaint  Patient presents with   F/U Hematoma on Leg    Goes to wound care center this afternoon. Thinks like it may be improving. Keeps it wrapped and using triple antibiotic ointment and cleaning once a day.    HPI: Keith [name] is a 23 y.o. male presenting on 09/10/2023 for F/U Hematoma on Leg (Goes to wound care center this afternoon. Thinks like it may be improving. Keeps it wrapped and using triple antibiotic ointment and cleaning once a day.)  Went to [facility] 6/22 to evaluate a skin wound. They did an evaluation and they suspected most consistent with soft tissue injury with hematoma. Xray was completed and negative for fracture. Discharged with supportive care with warm compresses.   Seen in our office 7/10 with Dr. Avelina to evaluate as was not improving so she prescribed doxycycline  100 mg twice daily x 10 days. Mom has been cleaning daily and using dressings daily. She was referred to wound care and they have their first consult today.   No fevers, mom states that the wound appears to be trying to scab and no worsening redness. Around the outside of the wound mom states it 'white looking' and is not warm.        ROS: Negative unless specifically indicated above in HPI.   Relevant past medical history reviewed and updated as indicated.   Allergies and medications reviewed and updated.   Current Outpatient Medications:    amiodarone (PACERONE) 200 MG tablet, Take 300 mg by mouth daily., Disp: , Rfl:    aspirin EC 81 MG tablet, Take 81 mg by mouth daily., Disp: , Rfl:    desonide  (DESOWEN ) 0.05 % cream, Apply topically 2 (two) times daily., Disp: 30 g, Rfl: 0   doxycycline  (VIBRA -TABS) 100 MG tablet, Take 1 tablet (100 mg total) by mouth 2 (two) times daily. Soak, crush and mild with applesauce, Disp: 20 tablet, Rfl: 0    furosemide (LASIX) 10 MG/ML solution, Take 10 mg by mouth 2 (two) times daily., Disp: , Rfl:    LORazepam  (ATIVAN ) 2 MG tablet, Take 1 tablet (2 mg total) by mouth as needed for anxiety., Disp: 20 tablet, Rfl: 2   propranolol (INDERAL) 60 MG tablet, Take 60 mg by mouth 3 (three) times daily., Disp: , Rfl:    Sildenafil Citrate 10 MG/ML SUSR, Take 10 mg by mouth 3 (three) times daily., Disp: , Rfl:    spironolactone (ALDACTONE) 25 MG tablet, Take 25 mg by mouth daily., Disp: , Rfl: 6   traZODone  (DESYREL ) 100 MG tablet, Take 1 tablet (100 mg total) by mouth at bedtime., Disp: 90 tablet, Rfl: 3   Vitamin D3 (VITAMIN D) 25 MCG tablet, Take 1,000 Units by mouth daily., Disp: , Rfl:   Allergies  Allergen Reactions   Adhesive [Tape] Itching and Rash    Able to tolerate paper tape    Silicone Rash   Wound Dressing Adhesive Itching and Rash    blisters, Able to tolerate paper tape    Objective:   BP 92/60 (BP Location: Left Arm, Patient Position: Sitting, Cuff Size: Large)   Pulse 60   Temp 98.2 F (36.8 C) (Oral)   Ht 5' 3.5 (1.613 m)   Wt 149 lb (67.6 kg)   SpO2 93%   BMI 25.98 kg/m  Physical Exam Skin:    Findings: Wound (left lower lateral wound with surrounding white pigmentation no warmth to site. wound with some pink granulation, clear to blood discharge with some yellow discharge/sloughing) present.       Assessment & Plan:  Open wound of left lower extremity, initial encounter Assessment & Plan: Pt with wound care consult later today Wound culture collected Wrapped again with non adherant dressing and koban  Pt advised to:  Please monitor site for worsening signs/symptoms of infection to include: increasing redness, increasing tenderness, increase in size, and or pustulant drainage from site. If this is to occur please let me know immediately.  Continue duration of doxycycline . Will await wound care consult.  Orders: -     WOUND CULTURE  Venous stasis dermatitis  of both lower extremities -     WOUND CULTURE     Follow up plan: Return if symptoms worsen or fail to improve.  Ginger Patrick, FNP

## 2023-09-11 NOTE — Assessment & Plan Note (Signed)
 Pt with wound care consult later today Wound culture collected Wrapped again with non adherant dressing and koban  Pt advised to:  Please monitor site for worsening signs/symptoms of infection to include: increasing redness, increasing tenderness, increase in size, and or pustulant drainage from site. If this is to occur please let me know immediately.  Continue duration of doxycycline . Will await wound care consult.

## 2023-09-13 LAB — WOUND CULTURE
MICRO NUMBER:: 16712276
SPECIMEN QUALITY:: ADEQUATE

## 2023-09-14 ENCOUNTER — Ambulatory Visit: Payer: Self-pay | Admitting: Family

## 2023-09-14 ENCOUNTER — Encounter: Payer: Self-pay | Admitting: Family

## 2023-09-14 DIAGNOSIS — S81802D Unspecified open wound, left lower leg, subsequent encounter: Secondary | ICD-10-CM

## 2023-09-14 DIAGNOSIS — A4902 Methicillin resistant Staphylococcus aureus infection, unspecified site: Secondary | ICD-10-CM

## 2023-09-16 NOTE — Telephone Encounter (Signed)
 Called and spoke with mom  She is still awaiting a call back from wound care in regards to recent wound culture. She has not yet received the bandages that they had ordered for her. She feels a bit helpless as she doesn't know what to do, she is still applying gentamicin ointment. Requesting another referral to [facility].   Pt is w/o fever. Mom does state seems to be improving some and she will send me a mychart message with updated picture.

## 2023-09-17 NOTE — Telephone Encounter (Signed)
 Called and spoke to mom.  Secondary referral placed for [facility].

## 2023-09-18 ENCOUNTER — Encounter (HOSPITAL_BASED_OUTPATIENT_CLINIC_OR_DEPARTMENT_OTHER): Admitting: Internal Medicine

## 2023-09-18 DIAGNOSIS — I87311 Chronic venous hypertension (idiopathic) with ulcer of right lower extremity: Secondary | ICD-10-CM

## 2023-09-18 DIAGNOSIS — L97822 Non-pressure chronic ulcer of other part of left lower leg with fat layer exposed: Secondary | ICD-10-CM

## 2023-09-18 DIAGNOSIS — F840 Autistic disorder: Secondary | ICD-10-CM

## 2023-09-18 DIAGNOSIS — T798XXA Other early complications of trauma, initial encounter: Secondary | ICD-10-CM

## 2023-09-25 ENCOUNTER — Encounter (HOSPITAL_BASED_OUTPATIENT_CLINIC_OR_DEPARTMENT_OTHER): Attending: Internal Medicine | Admitting: Internal Medicine

## 2023-09-25 DIAGNOSIS — T798XXA Other early complications of trauma, initial encounter: Secondary | ICD-10-CM | POA: Insufficient documentation

## 2023-09-25 DIAGNOSIS — X58XXXA Exposure to other specified factors, initial encounter: Secondary | ICD-10-CM | POA: Diagnosis not present

## 2023-09-25 DIAGNOSIS — I87312 Chronic venous hypertension (idiopathic) with ulcer of left lower extremity: Secondary | ICD-10-CM | POA: Diagnosis not present

## 2023-09-25 DIAGNOSIS — L97822 Non-pressure chronic ulcer of other part of left lower leg with fat layer exposed: Secondary | ICD-10-CM | POA: Insufficient documentation

## 2023-09-25 DIAGNOSIS — F840 Autistic disorder: Secondary | ICD-10-CM | POA: Diagnosis not present

## 2023-09-30 ENCOUNTER — Encounter: Payer: Self-pay | Admitting: Family

## 2023-10-02 ENCOUNTER — Encounter (HOSPITAL_BASED_OUTPATIENT_CLINIC_OR_DEPARTMENT_OTHER): Admitting: Internal Medicine

## 2023-10-02 DIAGNOSIS — I87311 Chronic venous hypertension (idiopathic) with ulcer of right lower extremity: Secondary | ICD-10-CM | POA: Diagnosis not present

## 2023-10-02 DIAGNOSIS — L97822 Non-pressure chronic ulcer of other part of left lower leg with fat layer exposed: Secondary | ICD-10-CM

## 2023-10-02 DIAGNOSIS — T798XXA Other early complications of trauma, initial encounter: Secondary | ICD-10-CM | POA: Diagnosis not present

## 2023-10-04 ENCOUNTER — Encounter (HOSPITAL_BASED_OUTPATIENT_CLINIC_OR_DEPARTMENT_OTHER): Payer: Self-pay

## 2023-10-04 ENCOUNTER — Emergency Department (HOSPITAL_BASED_OUTPATIENT_CLINIC_OR_DEPARTMENT_OTHER)
Admission: EM | Admit: 2023-10-04 | Discharge: 2023-10-05 | Disposition: A | Discharge status: Home / Self Care | Attending: Emergency Medicine | Admitting: Emergency Medicine

## 2023-10-04 ENCOUNTER — Telehealth: Admitting: Family

## 2023-10-04 ENCOUNTER — Other Ambulatory Visit: Payer: Self-pay

## 2023-10-04 DIAGNOSIS — Z7982 Long term (current) use of aspirin: Secondary | ICD-10-CM | POA: Insufficient documentation

## 2023-10-04 DIAGNOSIS — F840 Autistic disorder: Secondary | ICD-10-CM | POA: Diagnosis not present

## 2023-10-04 DIAGNOSIS — F79 Unspecified intellectual disabilities: Secondary | ICD-10-CM

## 2023-10-04 DIAGNOSIS — N390 Urinary tract infection, site not specified: Secondary | ICD-10-CM | POA: Diagnosis not present

## 2023-10-04 DIAGNOSIS — R350 Frequency of micturition: Secondary | ICD-10-CM

## 2023-10-04 DIAGNOSIS — R509 Fever, unspecified: Secondary | ICD-10-CM | POA: Diagnosis present

## 2023-10-04 LAB — URINALYSIS, W/ REFLEX TO CULTURE (INFECTION SUSPECTED)
Glucose, UA: NEGATIVE mg/dL
Hgb urine dipstick: NEGATIVE
Nitrite: NEGATIVE
Protein, ur: 30 mg/dL — AB
Specific Gravity, Urine: 1.045 — ABNORMAL HIGH (ref 1.005–1.030)
WBC, UA: >50 WBC/hpf (ref 0–5)
pH: 6 (ref 5.0–8.0)

## 2023-10-04 MED ORDER — CEPHALEXIN 250 MG/5ML PO SUSR: 500.0000 mg | Freq: Two times a day (BID) | ORAL | 0 refills | Status: AC

## 2023-10-04 MED ORDER — CEPHALEXIN 250 MG PO CAPS
500.0000 mg | ORAL_CAPSULE | Freq: Once | ORAL | Status: AC
  Administered 2023-10-05 (×2): 500 mg via ORAL
  Filled 2023-10-04: qty 2

## 2023-10-04 MED ORDER — ONDANSETRON 4 MG PO TBDP: 4.0000 mg | ORAL_TABLET | Freq: Three times a day (TID) | ORAL | 0 refills | Status: AC | PRN

## 2023-10-04 NOTE — ED Provider Notes (Signed)
 [place] EM[facility] DEPARTMENT AT [facility] Provider Note   CSN: [id number] Arrival date & time: 10/04/23  2117     Patient presents with: Fever   Keith [name] is a 23 y.o. male past medical history significant for autism presents today for fever and possible UTI.  Patient's guardian reports that the patient had his discomfort while urinating.  Patient is nonverbal at baseline.  Guardian reports decreased energy.  Guardian gave patient Tylenol while at home.    Fever Associated symptoms: dysuria        Prior to Admission medications   Medication Sig Start Date End Date Taking? Authorizing Provider  cephALEXin  (KEFLEX ) 250 MG/5ML suspension Take 10 mLs (500 mg total) by mouth 2 (two) times daily for 5 days. 10/04/23 10/09/23 Yes Francis Ileana SAILOR, [name]  ondansetron  (ZOFRAN -ODT) 4 MG disintegrating tablet Take 1 tablet (4 mg total) by mouth every 8 (eight) hours as needed for nausea or vomiting. 10/04/23  Yes Mendy Chou N, [name]  amiodarone (PACERONE) 200 MG tablet Take 300 mg by mouth daily. 08/03/21   [provider]  aspirin EC 81 MG tablet Take 81 mg by mouth daily.    [provider]  desonide  (DESOWEN ) 0.05 % cream Apply topically 2 (two) times daily. 11/14/22   Corwin Antu, FNP  furosemide (LASIX) 10 MG/ML solution Take 10 mg by mouth 2 (two) times daily. 12/02/18   [provider]  LORazepam  (ATIVAN ) 2 MG tablet Take 1 tablet (2 mg total) by mouth as needed for anxiety. 11/14/22   Corwin Antu, FNP  propranolol (INDERAL) 60 MG tablet Take 60 mg by mouth 3 (three) times daily. 09/02/21   [provider]  Sildenafil Citrate 10 MG/ML SUSR Take 10 mg by mouth 3 (three) times daily. 09/30/21   [provider]  spironolactone (ALDACTONE) 25 MG tablet Take 25 mg by mouth daily. 10/06/17   [provider]  traZODone  (DESYREL ) 100 MG tablet Take 1 tablet (100 mg total) by mouth at bedtime. 08/03/23   Dugal, Tabitha, FNP  Vitamin  D3 (VITAMIN D) 25 MCG tablet Take 1,000 Units by mouth daily.    [provider]    Allergies: Adhesive [tape], Silicone, and Wound dressing adhesive    Review of Systems  Constitutional:  Positive for fever.  Genitourinary:  Positive for dysuria.    Updated Vital Signs BP 100/65 (BP Location: Left Arm)   Pulse (!) 59   Temp 97.7 F (36.5 C) (Axillary)   Resp 18   SpO2 94%   Physical Exam Vitals and nursing note reviewed.  Constitutional:      General: He is not in acute distress.    Appearance: He is well-developed. He is not ill-appearing.  HENT:     Head: Normocephalic and atraumatic.     Right Ear: External ear normal.     Left Ear: External ear normal.  Eyes:     Conjunctiva/sclera: Conjunctivae normal.  Cardiovascular:     Rate and Rhythm: Normal rate and regular rhythm.     Pulses: Normal pulses.     Heart sounds: Normal heart sounds.  Pulmonary:     Effort: Pulmonary effort is normal. No respiratory distress.     Breath sounds: Normal breath sounds.  Abdominal:     Palpations: Abdomen is soft.     Tenderness: There is no abdominal tenderness.  Musculoskeletal:        General: No swelling.     Cervical back: Neck supple.  Skin:    General: Skin is warm and dry.     Capillary Refill: Capillary refill takes less than 2 seconds.  Neurological:     Mental Status: He is alert. Mental status is at baseline.     (all labs ordered are listed, but only abnormal results are displayed) Labs Reviewed  URINALYSIS, W/ REFLEX TO CULTURE (INFECTION SUSPECTED) - Abnormal; Notable for the following components:      Result Value   Color, Urine ORANGE (*)    APPearance HAZY (*)    Specific Gravity, Urine 1.045 (*)    Bilirubin Urine SMALL (*)    Ketones, ur TRACE (*)    Protein, ur 30 (*)    Leukocytes,Ua LARGE (*)    Bacteria, UA RARE (*)    All other components within normal limits  URINE CULTURE    EKG: None  Radiology: No results  found.   Procedures   Medications Ordered in the ED  cephALEXin  (KEFLEX ) 250 MG/5ML suspension 500 mg (has no administration in time range)                                    Medical Decision Making  This patient presents to the ED for concern of UTI differential diagnosis includes urinary tract infection, pyelonephritis, kidney stone    Additional history obtained   Additional history obtained from Electronic Medical Record External records from outside source obtained and reviewed including family medicine notes   Lab Tests: I Ordered, and personally interpreted labs.  The pertinent results include: UA with small bilirubin, trace ketones, large leukocytes, 30 protein, elevated specific gravity, rare bacteria, 6-10 RBCs, greater than 50 WBCs Urine culture pending  Problem List / ED Course:  Patient given Keflex  while in ED Considered for admission or further workup however patient's vital signs, physical exam, and labs are reassuring.  Patient's symptoms likely due to urinary tract infection.  Patient given outpatient treatment with Keflex  and Zofran  for nausea and vomiting.  Patient has culture pending will be notified if these results require a change in medication.  Patient given return precautions.  I feel patient safe for discharge at this time.       Final diagnoses:  Urinary tract infection with hematuria, site unspecified    ED Discharge Orders          Ordered    cephALEXin  (KEFLEX ) 250 MG/5ML suspension  2 times daily        10/04/23 2357    ondansetron  (ZOFRAN -ODT) 4 MG disintegrating tablet  Every 8 hours PRN        10/04/23 2358               Francis Ileana SAILOR, [name] 10/04/23 2358    Zackowski, Scott, MD 10/05/23 [phone]

## 2023-10-04 NOTE — ED Triage Notes (Signed)
 Pt presents via POV c/o fever and possible UTI. Reports patient uncomfortable while urinating. Pt is non-verbal at baseline. Decreased energy. Mother reports pt felt warm at home so gave Tylenol.

## 2023-10-04 NOTE — Progress Notes (Signed)
 Virtual Visit Consent   [place], you are scheduled for a virtual visit with a [facility] provider today. Just as with appointments in the office, your consent must be obtained to participate. Your consent will be active for this visit and any virtual visit you may have with one of our providers in the next 365 days. If you have a MyChart account, a copy of this consent can be sent to you electronically.  As this is a virtual visit, video technology does not allow for your provider to perform a traditional examination. This may limit your provider's ability to fully assess your condition. If your provider identifies any concerns that need to be evaluated in person or the need to arrange testing (such as labs, EKG, etc.), we will make arrangements to do so. Although advances in technology are sophisticated, we cannot ensure that it will always work on either your end or our end. If the connection with a video visit is poor, the visit may have to be switched to a telephone visit. With either a video or telephone visit, we are not always able to ensure that we have a secure connection.  By engaging in this virtual visit, you consent to the provision of healthcare and authorize for your insurance to be billed (if applicable) for the services provided during this visit. Depending on your insurance coverage, you may receive a charge related to this service.  I need to obtain your verbal consent now. Are you willing to proceed with your visit today? Keith [name] has provided verbal consent on 10/04/2023 for a virtual visit (video or telephone). Bari Learn, FNP  Mother gives consent to treat patient.   Date: 10/04/2023 7:30 PM   Virtual Visit via Video Note   I, Bari Learn, connected with  Keith [name]  ([id number], 10-Jun-2000) on 10/04/23 at  7:30 PM EDT by a video-enabled telemedicine application and verified that I am speaking with the correct person using two  identifiers.  Location: Patient: Virtual Visit Location Patient: Home Provider: Virtual Visit Location Provider: Home Office   I discussed the limitations of evaluation and management by telemedicine and the availability of in person appointments. The patient expressed understanding and agreed to proceed.    History of Present Illness: Keith [name] is a 23 y.o. who identifies as a male who was assigned male at birth, and is being seen today for urinary frequency and dysuria. Pt is nonverbal. He is incontinence and mother has noticed a dark urine.  Mother does report a fever.   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   Recurrent UTI 10/01/2021   Pulmonary hypertension, primary (HCC) 10/01/2021   Sleep disorder 10/01/2021   Venous stasis dermatitis of both lower extremities 10/01/2021   Allergic rhinitis 10/01/2021   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) 10/01/2021   Sensitivity to sunlight 10/01/2021   Intellectual disability 10/01/2021   Lymphopenia 10/01/2021   Thrombocytopenia (HCC) 10/01/2021   History  of recurrent UTIs 01/12/2020   H/O seasonal allergies 05/04/2019   History of gastrointestinal ulcer 12/19/2018   Constipation 12/18/2018   Status post ablation of atrial flutter 07/11/2015   Ventricular tachycardia (HCC) 06/21/2015   Ventricular fibrillation (HCC) 06/21/2015   Status post catheter ablation of slow pathway 06/07/2015   Dental disease  08/10/2014   Impulse control disorder 02/14/2013   SVT (supraventricular tachycardia) (HCC) 07/05/2012   Complete A-V canal, 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   Mitral regurgitation 12/10/2011    Allergies:  Allergies  Allergen Reactions   Adhesive [Tape] Itching and Rash    Able to tolerate paper tape    Silicone Rash   Wound Dressing Adhesive Itching and Rash    blisters, Able to tolerate paper tape   Medications:  Current Outpatient Medications:    amiodarone (PACERONE) 200 MG tablet, Take 300 mg by mouth daily., Disp: , Rfl:    aspirin EC 81 MG tablet, Take 81 mg by mouth daily., Disp: , Rfl:    desonide  (DESOWEN ) 0.05 % cream, Apply topically 2 (two) times daily., Disp: 30 g, Rfl: 0   furosemide (LASIX) 10 MG/ML solution, Take 10 mg by mouth 2 (two) times daily., Disp: , Rfl:    LORazepam  (ATIVAN ) 2 MG tablet, Take 1 tablet (2 mg total) by mouth as needed for anxiety., Disp: 20 tablet, Rfl: 2   propranolol (INDERAL) 60 MG tablet, Take 60 mg by mouth 3 (three) times daily., Disp: , Rfl:    Sildenafil Citrate 10 MG/ML SUSR, Take 10 mg by mouth 3 (three) times daily., Disp: , Rfl:    spironolactone (ALDACTONE) 25 MG tablet, Take 25 mg by mouth daily., Disp: , Rfl: 6   traZODone  (DESYREL ) 100 MG tablet, Take 1 tablet (100 mg total) by mouth at bedtime., Disp: 90 tablet, Rfl: 3   Vitamin D3 (VITAMIN D) 25 MCG tablet, Take 1,000 Units by mouth daily., Disp: , Rfl:   Observations/Objective: Patient is well-developed, well-nourished in no acute distress.  Resting comfortably  Head is normocephalic, atraumatic.  No labored breathing.  Speech is clear and coherent with logical content.  Patient nonverbal, laying around    Assessment and Plan: 1. Urinary frequency (Primary)  2. Fever, unspecified fever cause  3. Autism spectrum disorder  4. Intellectual disability  Given UTI symptoms with fever and being nonverbal recommend being seen tonight at Urgent Care for urine culture.  Force  fluids  Follow Up Instructions: I discussed the assessment and treatment plan with the patient. The patient was provided an opportunity to ask questions and all were answered. The patient agreed with the plan and demonstrated an understanding of the instructions.  A copy of instructions were sent to the patient via MyChart unless otherwise noted below.     The patient was advised to call back or seek an in-person evaluation if the symptoms worsen or if the condition fails to improve as anticipated.    Bari Learn, FNP

## 2023-10-04 NOTE — Discharge Instructions (Addendum)
 You were seen for a urinary tract infection.  Please pick up your antibiotic and take as prescribed.  You have also been prescribed Zofran  as needed for nausea and vomiting.  You have a culture pending and will be notified if these results require a change in medication.  Thank you for letting us  treat you today. After reviewing your labs, I feel you are safe to go home. Please follow up with your PCP in the next several days and provide them with your records from this visit. Return to the Emergency Room if pain becomes severe or symptoms worsen.

## 2023-10-05 DIAGNOSIS — N390 Urinary tract infection, site not specified: Secondary | ICD-10-CM | POA: Diagnosis not present

## 2023-10-08 ENCOUNTER — Telehealth (HOSPITAL_BASED_OUTPATIENT_CLINIC_OR_DEPARTMENT_OTHER): Payer: Self-pay

## 2023-10-08 NOTE — Telephone Encounter (Signed)
 Post ED Visit - Positive Culture Follow-up  Culture report reviewed by antimicrobial stewardship pharmacist: [facility] []  Rankin Dee, Pharm.D. [x]  Venetia Gully, Pharm.D., BCPS AQ-ID []  Garrel Crews, Pharm.D., BCPS []  Almarie Lunger, Pharm.D., BCPS []  [place], [place].D., BCPS, AAHIVP []  Rosaline Bihari, Pharm.D., BCPS, AAHIVP []  Vernell Meier, PharmD, BCPS []  Latanya Hint, PharmD, BCPS []  Donald Medley, PharmD, BCPS []  Rocky Bold, PharmD []  Dorothyann Alert, PharmD, BCPS []  Morene Babe, PharmD  [facility] []  Rosaline Edison, PharmD []  Romona Bliss, PharmD []  Dolphus Roller, PharmD []  Veva Seip, Rph []  Vernell Daunt) Leonce, PharmD []  Eva Allis, PharmD []  Rosaline Millet, PharmD []  Iantha Batch, PharmD []  Arvin Gauss, PharmD []  Wanda Hasting, PharmD []  Ronal Rav, PharmD []  Rocky Slade, PharmD []  Bard Jeans, PharmD   Positive urine culture Treated with Cephalexin , organism sensitive to the same and no further patient follow-up is required at this time.  Keith [name] 10/08/2023, 10:39 AM

## 2023-10-10 LAB — URINE CULTURE: Culture: 10000 — AB

## 2023-10-11 ENCOUNTER — Telehealth (HOSPITAL_BASED_OUTPATIENT_CLINIC_OR_DEPARTMENT_OTHER): Payer: Self-pay | Admitting: *Deleted

## 2023-10-11 NOTE — Telephone Encounter (Signed)
 Post ED Visit - Positive Culture Follow-up  Culture report reviewed by antimicrobial stewardship pharmacist: [facility] []  Rankin Dee, Pharm.D. []  Venetia Gully, [address].D., BCPS AQ-ID []  Garrel Crews, Pharm.D., BCPS []  Almarie Lunger, [address].D., BCPS []  [place], [address].D., BCPS, AAHIVP []  Rosaline Bihari, Pharm.D., BCPS, AAHIVP []  Vernell Meier, PharmD, BCPS []  Latanya Hint, PharmD, BCPS []  Donald Medley, PharmD, BCPS []  Rocky Bold, PharmD []  Dorothyann Alert, PharmD, BCPS [x]  Dorn Buttner, PharmD  [facility] []  Rosaline Edison, PharmD []  Romona Bliss, PharmD []  Dolphus Roller, PharmD []  Veva Seip, Rph []  Vernell Daunt) Leonce, PharmD []  Eva Allis, PharmD []  Rosaline Millet, PharmD []  Iantha Batch, PharmD []  Arvin Gauss, PharmD []  Wanda Hasting, PharmD []  Ronal Rav, PharmD []  Rocky Slade, PharmD []  Bard Jeans, PharmD   Positive urine culture Treated with Cephalexin , organism sensitive to the same and no further patient follow-up is required at this time.  Keith [name] 10/11/2023, 2:35 PM

## 2023-10-16 ENCOUNTER — Encounter (HOSPITAL_BASED_OUTPATIENT_CLINIC_OR_DEPARTMENT_OTHER): Admitting: Internal Medicine

## 2023-10-16 DIAGNOSIS — T798XXA Other early complications of trauma, initial encounter: Secondary | ICD-10-CM | POA: Diagnosis not present

## 2023-10-16 DIAGNOSIS — F840 Autistic disorder: Secondary | ICD-10-CM | POA: Diagnosis not present

## 2023-10-16 DIAGNOSIS — I87312 Chronic venous hypertension (idiopathic) with ulcer of left lower extremity: Secondary | ICD-10-CM

## 2023-10-16 DIAGNOSIS — L97822 Non-pressure chronic ulcer of other part of left lower leg with fat layer exposed: Secondary | ICD-10-CM | POA: Diagnosis not present

## 2023-10-22 ENCOUNTER — Ambulatory Visit

## 2023-10-23 ENCOUNTER — Encounter (HOSPITAL_BASED_OUTPATIENT_CLINIC_OR_DEPARTMENT_OTHER): Admitting: Internal Medicine

## 2023-10-23 DIAGNOSIS — F840 Autistic disorder: Secondary | ICD-10-CM | POA: Diagnosis not present

## 2023-10-23 DIAGNOSIS — T798XXA Other early complications of trauma, initial encounter: Secondary | ICD-10-CM

## 2023-10-23 DIAGNOSIS — L97822 Non-pressure chronic ulcer of other part of left lower leg with fat layer exposed: Secondary | ICD-10-CM

## 2023-10-23 DIAGNOSIS — I87312 Chronic venous hypertension (idiopathic) with ulcer of left lower extremity: Secondary | ICD-10-CM | POA: Diagnosis not present

## 2023-11-06 ENCOUNTER — Encounter (HOSPITAL_BASED_OUTPATIENT_CLINIC_OR_DEPARTMENT_OTHER): Attending: Internal Medicine | Admitting: Internal Medicine

## 2023-11-06 DIAGNOSIS — L97822 Non-pressure chronic ulcer of other part of left lower leg with fat layer exposed: Secondary | ICD-10-CM | POA: Diagnosis not present

## 2023-11-06 DIAGNOSIS — F840 Autistic disorder: Secondary | ICD-10-CM

## 2023-11-06 DIAGNOSIS — T798XXA Other early complications of trauma, initial encounter: Secondary | ICD-10-CM | POA: Diagnosis not present

## 2023-11-06 DIAGNOSIS — I87312 Chronic venous hypertension (idiopathic) with ulcer of left lower extremity: Secondary | ICD-10-CM | POA: Diagnosis not present

## 2023-11-19 ENCOUNTER — Ambulatory Visit (INDEPENDENT_AMBULATORY_CARE_PROVIDER_SITE_OTHER)

## 2023-11-19 DIAGNOSIS — Z23 Encounter for immunization: Secondary | ICD-10-CM

## 2024-02-17 ENCOUNTER — Ambulatory Visit: Admitting: Family
